# Patient Record
Sex: Female | Born: 1975 | Race: White | Hispanic: No | Marital: Married | Smoking: Never smoker
Health system: Southern US, Community
[De-identification: ages and names within clinical notes are randomized; demographics above are authoritative.]

## PROBLEM LIST (undated history)

## (undated) DIAGNOSIS — M545 Low back pain, unspecified: Secondary | ICD-10-CM

## (undated) HISTORY — DX: Low back pain, unspecified: M54.50

## (undated) HISTORY — DX: Low back pain: M54.5

---

## 2006-10-13 ENCOUNTER — Inpatient Hospital Stay: Payer: Self-pay | Admitting: Obstetrics and Gynecology

## 2007-08-22 ENCOUNTER — Ambulatory Visit: Payer: Self-pay | Admitting: Family Medicine

## 2007-08-22 DIAGNOSIS — B078 Other viral warts: Secondary | ICD-10-CM | POA: Insufficient documentation

## 2008-02-09 ENCOUNTER — Other Ambulatory Visit: Admission: RE | Admit: 2008-02-09 | Discharge: 2008-02-09 | Payer: Self-pay | Admitting: Family Medicine

## 2008-02-09 ENCOUNTER — Ambulatory Visit: Payer: Self-pay | Admitting: Family Medicine

## 2008-02-09 ENCOUNTER — Encounter: Payer: Self-pay | Admitting: Family Medicine

## 2008-02-09 DIAGNOSIS — E669 Obesity, unspecified: Secondary | ICD-10-CM | POA: Insufficient documentation

## 2008-02-13 ENCOUNTER — Encounter (INDEPENDENT_AMBULATORY_CARE_PROVIDER_SITE_OTHER): Payer: Self-pay | Admitting: *Deleted

## 2008-05-02 ENCOUNTER — Ambulatory Visit: Payer: Self-pay | Admitting: Family Medicine

## 2008-05-09 ENCOUNTER — Telehealth (INDEPENDENT_AMBULATORY_CARE_PROVIDER_SITE_OTHER): Payer: Self-pay | Admitting: *Deleted

## 2009-07-26 ENCOUNTER — Other Ambulatory Visit: Admission: RE | Admit: 2009-07-26 | Discharge: 2009-07-26 | Payer: Self-pay | Admitting: Family Medicine

## 2009-07-26 ENCOUNTER — Ambulatory Visit: Payer: Self-pay | Admitting: Family Medicine

## 2009-07-26 ENCOUNTER — Encounter: Payer: Self-pay | Admitting: Family Medicine

## 2009-07-26 DIAGNOSIS — M549 Dorsalgia, unspecified: Secondary | ICD-10-CM | POA: Insufficient documentation

## 2009-07-30 ENCOUNTER — Encounter (INDEPENDENT_AMBULATORY_CARE_PROVIDER_SITE_OTHER): Payer: Self-pay | Admitting: *Deleted

## 2012-02-03 ENCOUNTER — Encounter: Payer: Self-pay | Admitting: Family Medicine

## 2012-03-14 ENCOUNTER — Encounter: Payer: Self-pay | Admitting: Family Medicine

## 2012-03-15 ENCOUNTER — Other Ambulatory Visit (HOSPITAL_COMMUNITY)
Admission: RE | Admit: 2012-03-15 | Discharge: 2012-03-15 | Disposition: A | Discharge status: Home / Self Care | Payer: Managed Care, Other (non HMO) | Source: Ambulatory Visit | Attending: Family Medicine | Admitting: Family Medicine

## 2012-03-15 ENCOUNTER — Encounter: Payer: Self-pay | Admitting: Family Medicine

## 2012-03-15 ENCOUNTER — Ambulatory Visit (INDEPENDENT_AMBULATORY_CARE_PROVIDER_SITE_OTHER): Payer: Managed Care, Other (non HMO) | Admitting: Family Medicine

## 2012-03-15 VITALS — BP 102/70 | HR 87 | Temp 98.6°F | Ht 66.0 in | Wt 177.8 lb

## 2012-03-15 DIAGNOSIS — Z01419 Encounter for gynecological examination (general) (routine) without abnormal findings: Secondary | ICD-10-CM | POA: Insufficient documentation

## 2012-03-15 DIAGNOSIS — Z0000 Encounter for general adult medical examination without abnormal findings: Secondary | ICD-10-CM

## 2012-03-15 DIAGNOSIS — Z1159 Encounter for screening for other viral diseases: Secondary | ICD-10-CM | POA: Insufficient documentation

## 2012-03-15 NOTE — Assessment & Plan Note (Signed)
Reviewed health habits including diet and exercise and skin cancer prevention Also reviewed health mt list, fam hx and immunizations  Commended great job on healthy diet/ exercise and wt loss  Has a reasonable goal and doing well  Labs today Enc to get flu shot next season

## 2012-03-15 NOTE — Progress Notes (Signed)
Subjective:    Patient ID: Felicia Ibarra, female    DOB: [birth year], 35 y.o.   MRN: [id number]  HPI Here for health maintenance exam and to review chronic medical problems   Has been feeling pretty good  Trying to take care of herself   Has not had labs   bp is 102/70- good  Wt is down 3 lb with bmi of 28 (since august - lost 29 lb total )  Uses my fitness pal - and logs on for calories and exercise  Gets 1430 calories per day  Does not feel deprived   For exercise- lifts wt/ circuit train/ zumba/ yoga   Pap was 9/10- was nl No hx of abn paps  Hx -no gyn problems , periods are regular - cycle 30 days / no heavy bleeding or bad cramps - normal for her   Contraception- does not need now  Wants to work on getting pregnant after loosing some more    Flu shot - did not have   Patient Active Problem List  Diagnoses  . PLANTAR WART  . BACK PAIN  . Routine general medical examination at a health care facility  . Gynecological examination   Past Medical History  Diagnosis Date  . Low back pain, episodic    Past Surgical History  Procedure Date  . Cesarean section 2007   History  Substance Use Topics  . Smoking status: Never Smoker   . Smokeless tobacco: Not on file  . Alcohol Use: No   Family History  Problem Relation Age of Onset  . Diabetes Mother   . Hypertension Mother   . Hypothyroidism Mother   . Cancer Father     prostate  . Heart disease Maternal Grandfather     >70  . Heart disease Paternal Grandfather     > 70  . Hyperlipidemia Other   . Hypothyroidism Other   . Hypothyroidism Other    Allergies  Allergen Reactions  . Amoxicillin   . Cefaclor     REACTION: hives  . Ibuprofen     REACTION: hives   Current Outpatient Prescriptions on File Prior to Visit  Medication Sig Dispense Refill  . Multiple Vitamin (MULTIVITAMIN) tablet Take 1 tablet by mouth daily.         Has not had blood work in a while  For chol , and sugar and thyroid        Review of Systems Review of Systems  Constitutional: Negative for fever, appetite change, fatigue and unexpected weight change.  Eyes: Negative for pain and visual disturbance.  Respiratory: Negative for cough and shortness of breath.   Cardiovascular: Negative for cp or palpitations    Gastrointestinal: Negative for nausea, diarrhea and constipation.  Genitourinary: Negative for urgency and frequency.  Skin: Negative for pallor or rash   Neurological: Negative for weakness, light-headedness, numbness and headaches.  Hematological: Negative for adenopathy. Does not bruise/bleed easily.  Psychiatric/Behavioral: Negative for dysphoric mood. The patient is not nervous/anxious.          Objective:   Physical Exam  Constitutional: She appears well-developed and well-nourished. No distress.       overwt and well appearing   HENT:  Head: Normocephalic and atraumatic.  Right Ear: External ear normal.  Left Ear: External ear normal.  Nose: Nose normal.  Mouth/Throat: Oropharynx is clear and moist.  Eyes: Conjunctivae and EOM are normal. Pupils are equal, round, and reactive to light. No scleral icterus.  Neck: Normal range  of motion. Neck supple. No JVD present. Carotid bruit is not present. No thyromegaly present.  Cardiovascular: Normal rate, regular rhythm, normal heart sounds and intact distal pulses.  Exam reveals no gallop.   Pulmonary/Chest: Effort normal and breath sounds normal. No respiratory distress. She has no wheezes. She has no rales. She exhibits no tenderness.  Abdominal: Soft. Bowel sounds are normal. She exhibits no distension, no abdominal bruit and no mass. There is no tenderness.  Genitourinary: Rectum normal, vagina normal and uterus normal. No breast swelling, tenderness, discharge or bleeding. There is no rash or tenderness on the right labia. There is no rash or tenderness on the left labia. Uterus is not enlarged and not tender. Cervix exhibits no motion  tenderness, no discharge and no friability. Right adnexum displays no mass, no tenderness and no fullness. Left adnexum displays no mass, no tenderness and no fullness. No bleeding around the vagina. No vaginal discharge found.       Breast exam: No mass, nodules, thickening, tenderness, bulging, retraction, inflamation, nipple discharge or skin changes noted.  No axillary or clavicular LA.  Chaperoned exam.    Musculoskeletal: Normal range of motion. She exhibits no edema and no tenderness.  Lymphadenopathy:    She has no cervical adenopathy.  Neurological: She is alert. She has normal reflexes. No cranial nerve deficit. She exhibits normal muscle tone. Coordination normal.  Skin: Skin is warm and dry. No rash noted. No erythema. No pallor.       Some lentigos 3-4 mm nevus med R eyebrow is stable  Some regular app brown nevi on back and arms  Psychiatric: She has a normal mood and affect.          Assessment & Plan:

## 2012-03-15 NOTE — Patient Instructions (Signed)
Pap done today Labs today Great job with weight loss so far- keep it up  Keep exercising

## 2012-03-15 NOTE — Assessment & Plan Note (Signed)
Annual exam - pap done  Is on PNV and will consider trying to get pregnant in the fall

## 2012-03-16 LAB — CBC WITH DIFFERENTIAL/PLATELET
Basophils Relative: 0.3 % (ref 0.0–3.0)
Eosinophils Absolute: 0.1 10*3/uL (ref 0.0–0.7)
HCT: 40.7 % (ref 36.0–46.0)
Hemoglobin: 13.5 g/dL (ref 12.0–15.0)
Lymphocytes Relative: 30.3 % (ref 12.0–46.0)
MCHC: 33.2 g/dL (ref 30.0–36.0)
MCV: 87.9 fl (ref 78.0–100.0)
Monocytes Absolute: 0.4 10*3/uL (ref 0.1–1.0)
Neutro Abs: 4.5 10*3/uL (ref 1.4–7.7)
RBC: 4.64 Mil/uL (ref 3.87–5.11)

## 2012-03-16 LAB — LIPID PANEL
Cholesterol: 148 mg/dL (ref 0–200)
HDL: 57.9 mg/dL (ref >39.00)
LDL Cholesterol: 75 mg/dL (ref 0–99)
Total CHOL/HDL Ratio: 3
Triglycerides: 75 mg/dL (ref 0.0–149.0)

## 2012-03-16 LAB — COMPREHENSIVE METABOLIC PANEL
AST: 20 U/L (ref 0–37)
BUN: 9 mg/dL (ref 6–23)
Calcium: 9.5 mg/dL (ref 8.4–10.5)
Chloride: 105 mEq/L (ref 96–112)
Creatinine, Ser: 0.8 mg/dL (ref 0.4–1.2)

## 2012-03-17 ENCOUNTER — Encounter: Payer: Self-pay | Admitting: *Deleted

## 2012-03-23 ENCOUNTER — Encounter: Payer: Self-pay | Admitting: *Deleted

## 2013-07-20 ENCOUNTER — Inpatient Hospital Stay: Payer: Self-pay

## 2013-07-20 LAB — CBC WITH DIFFERENTIAL/PLATELET
Basophil #: 0 10*3/uL (ref 0.0–0.1)
Eosinophil #: 0 10*3/uL (ref 0.0–0.7)
HGB: 11.5 g/dL — ABNORMAL LOW (ref 12.0–16.0)
Lymphocyte #: 2.2 10*3/uL (ref 1.0–3.6)
MCV: 86 fL (ref 80–100)
Neutrophil #: 10 10*3/uL — ABNORMAL HIGH (ref 1.4–6.5)
RDW: 13.2 % (ref 11.5–14.5)

## 2013-07-24 LAB — PATHOLOGY REPORT

## 2015-03-08 NOTE — Op Note (Signed)
PATIENT NAME:  Consepcion HearingROBBINS, Felicia Ibarra MR#:  [id number] DATE OF BIRTH:  01-28-1976  DATE OF PROCEDURE:  07/20/2013  PREOPERATIVE DIAGNOSES:  Term intrauterine pregnancy, attempted vaginal birth after cesarean, fetal intolerance to labor.   POSTOPERATIVE DIAGNOSES:  Term intrauterine pregnancy, attempted vaginal birth after cesarean, fetal intolerance to labor.   PROCEDURE PERFORMED: Low transverse cesarean section.   SURGEON:  Annamarie MajorPaul Harris, M.D.   ASSISTANT: Duwaine MaxinAshley Smith.     ANESTHESIA: General.   BLOOD LOSS: 200 mL.   COMPLICATIONS: None.   FINDINGS: Thin, scarred lower uterine segment, viable female infant weighing 7 pounds, 5 ounces with Apgar scores of 1 and 7 at 1 and 5 minutes, respectively.   DISPOSITION: To recovery room in stable condition.   TECHNIQUE: The patient was prepped and draped in the usual sterile fashion after adequate anesthesia was obtained in the supine position on the operating room table. Scalpel was used to create a low transverse skin incision down to the level of the rectus fascia, which was also dissected bilaterally using the scalpel. The rectus muscles were separated in the midline. Peritoneum penetrated, and the bladder inferiorly retracted. A scalpel was used to create a low transverse hysterotomy incision above the bladder reflection and carried down to the level of full penetration, which is then extended by blunt dissection. Amniotomy reveals clear fluid. The infant's head is grasped and delivered without use of a suction device and is delivered quickly with nuchal cord reduced. Umbilical cord was clamped and cut, and the infant was handed to the pediatric team.   Cord gas and cord blood was obtained. The placenta was manually extracted. The uterus was externalized and cleansed of all membranes and debris using a moist sponge. Hysterotomy incision was closed with a running #1 Vicryl suture in a locking fashion with excellent hemostasis noted. The uterus was placed  back in the intra-abdominal cavity, and the paracolic gutters were irrigated with warm saline. Re-examination of the incision reveals excellent hemostasis. The rectus muscles were plicated, and the rectus fascia was closed with 0 Maxon suture. Subcutaneous tissues were irrigated and hemostasis was assured using electrocautery. The subcutaneous cuticular layer is first injected with 0.5% Marcaine and then closed with 4-0 Vicryl suture in a subcuticular fashion, followed by placement of Dermabond. The patient goes to the recovery room in stable condition. All sponge, instrument and needle counts were correct.    ____________________________ R. Annamarie MajorPaul Harris, MD rph:dmm D: 07/20/2013 22:21:17 ET T: 07/20/2013 22:36:32 ET JOB#: [id number]  cc: Dierdre Searles. Paul Harris, MD, <Dictator> Nadara MustardOBERT P HARRIS MD ELECTRONICALLY SIGNED 07/21/2013 8:56

## 2015-03-26 NOTE — H&P (Signed)
L&D Evaluation:  History:  HPI 39 year old G2 P1001 with hx of prior C-section and EDC=07/16/2013 by LMP=10/09/2012 and a 12 week ultrasound presented at 40 4/7 weeks presented with c/o contractions all night becoming stronger and q5 min this AM. Rates pain at 6-7/10. No LOF or VB. Patient desires VBAC and has been counseled during pregnancy regarding the pros, cons and risks of TOL vs repeat C-section. She has a C-section scheduled for tomorrow if she was not in labor. Her previous C-section was done for FITL at 3 cm. Desires to breast feed. Condoms for pp contraception. LABS: O POS, RI, VI, GBS negative   Presents with contractions   Patient's Medical History No Chronic Illness   Patient's Surgical History Previous C-Section   Medications Pre Natal Vitamins   Allergies PCN, cephalosporins, ibuprofen-all cause hives.   Social History none   Family History Non-Contributory   ROS:  ROS see HPI   Exam:  Vital Signs stable  114/71   Urine Protein not completed   General breathing thru contractions   Mental Status clear   Chest clear   Heart normal sinus rhythm, no murmur/gallop/rubs   Abdomen gravid, tender with contractions   Estimated Fetal Weight Average for gestational age   Fetal Position cephalic   Edema no edema   Reflexes 1+   Pelvic no external lesions, cx 4/80%/ballotable per RN exam-no change after 2 hrs of walking   Mebranes Intact   FHT normal rate with no decels, 130 with accels to 150s   FHT Description mod variability   Ucx q3-5 min apart, palpate mod   Skin dry   Impression:  Impression IUP at 40 4/7 weeks in early labor. Prior C-section-desires TOLAC   Plan:  Plan EFM/NST, monitor contractions and for cervical change, Exhausted with no sleep all night. Offered therapeutic rest, AROM,.  Patient desires therapeutic rest. Morphine 10 mgm and Phenergan 12.5 mgm IM. Monitor for progression-initiate VBAC protocol when active.   Electronic  Signatures: Trinna BalloonGutierrez, Dulcy Sida L (CNM)  (Signed 04-Sep-14 10:40)  Authored: L&D Evaluation   Last Updated: 04-Sep-14 10:40 by Trinna BalloonGutierrez, Laurabelle Gorczyca L (CNM)

## 2015-04-24 ENCOUNTER — Ambulatory Visit (INDEPENDENT_AMBULATORY_CARE_PROVIDER_SITE_OTHER): Payer: 59 | Admitting: Family Medicine

## 2015-04-24 ENCOUNTER — Other Ambulatory Visit (HOSPITAL_COMMUNITY)
Admission: RE | Admit: 2015-04-24 | Discharge: 2015-04-24 | Disposition: A | Discharge status: Home / Self Care | Payer: 59 | Source: Ambulatory Visit | Attending: Family Medicine | Admitting: Family Medicine

## 2015-04-24 ENCOUNTER — Encounter: Payer: Self-pay | Admitting: Family Medicine

## 2015-04-24 VITALS — BP 100/80 | HR 72 | Temp 98.9°F | Ht 64.5 in

## 2015-04-24 DIAGNOSIS — Z01419 Encounter for gynecological examination (general) (routine) without abnormal findings: Secondary | ICD-10-CM | POA: Diagnosis not present

## 2015-04-24 DIAGNOSIS — Z0000 Encounter for general adult medical examination without abnormal findings: Secondary | ICD-10-CM | POA: Diagnosis not present

## 2015-04-24 DIAGNOSIS — Z1151 Encounter for screening for human papillomavirus (HPV): Secondary | ICD-10-CM | POA: Insufficient documentation

## 2015-04-24 NOTE — Assessment & Plan Note (Signed)
Routine exam with pap No complaints  

## 2015-04-24 NOTE — Progress Notes (Signed)
Subjective:    Patient ID: Felicia Ibarra, female    DOB: 1976/03/15, 39 y.o.   MRN: [id number]  HPI   Here for health maintenance exam and to review chronic medical problems    Lost 45 lb with diet and exercise  Doing great  Uses myfitness pal Runs    Declines HIV screen-not high risk   Pap nl 12/14  Does not go to gyn any more  Wants to get pap today  Periods are normal and regular for the most part  Not very heavy or painful   Flu shot -did not get   Tetanus shot 3/09  Wants to get wellness labs today   Has a mole on her face - over eyebrow R  Interested in seeing derm- will make appt    Review of Systems Review of Systems  Constitutional: Negative for fever, appetite change, fatigue and unexpected weight change.  Eyes: Negative for pain and visual disturbance.  Respiratory: Negative for cough and shortness of breath.   Cardiovascular: Negative for cp or palpitations    Gastrointestinal: Negative for nausea, diarrhea and constipation.  Genitourinary: Negative for urgency and frequency.  Skin: Negative for pallor or rash   Neurological: Negative for weakness, light-headedness, numbness and headaches.  Hematological: Negative for adenopathy. Does not bruise/bleed easily.  Psychiatric/Behavioral: Negative for dysphoric mood. The patient is not nervous/anxious.         Objective:   Physical Exam  Constitutional: She appears well-developed and well-nourished. No distress.  HENT:  Head: Normocephalic and atraumatic.  Right Ear: External ear normal.  Left Ear: External ear normal.  Nose: Nose normal.  Mouth/Throat: Oropharynx is clear and moist.  Eyes: Conjunctivae and EOM are normal. Pupils are equal, round, and reactive to light. Right eye exhibits no discharge. Left eye exhibits no discharge. No scleral icterus.  Neck: Normal range of motion. Neck supple. No JVD present. Carotid bruit is not present. No thyromegaly present.  Cardiovascular: Normal rate,  regular rhythm, normal heart sounds and intact distal pulses.  Exam reveals no gallop.   Pulmonary/Chest: Effort normal and breath sounds normal. No respiratory distress. She has no wheezes. She has no rales.  Abdominal: Soft. Bowel sounds are normal. She exhibits no distension and no mass. There is no tenderness.  Genitourinary: No breast swelling, tenderness, discharge or bleeding. There is no rash, tenderness or lesion on the right labia. There is no rash, tenderness or lesion on the left labia. Uterus is not enlarged and not tender. Cervix exhibits no motion tenderness, no discharge and no friability. Right adnexum displays no mass, no tenderness and no fullness. Left adnexum displays no mass, no tenderness and no fullness. No tenderness or bleeding in the vagina. No vaginal discharge found.  Breast exam: No mass, nodules, thickening, tenderness, bulging, retraction, inflamation, nipple discharge or skin changes noted.  No axillary or clavicular LA.      Musculoskeletal: She exhibits no edema or tenderness.  Lymphadenopathy:    She has no cervical adenopathy.  Neurological: She is alert. She has normal reflexes. No cranial nerve deficit. She exhibits normal muscle tone. Coordination normal.  Skin: Skin is warm and dry. No rash noted. No erythema. No pallor.  Psychiatric: She has a normal mood and affect.          Assessment & Plan:   Problem List Items Addressed This Visit    Encounter for routine gynecological examination    Routine exam with pap  No complaints  Relevant Orders   Cytology - PAP   Routine general medical examination at a health care facility - Primary    Reviewed health habits including diet and exercise and skin cancer prevention Reviewed appropriate screening tests for age  Also reviewed health mt list, fam hx and immunization status , as well as social and family history   See HPI Labs for wellness today  Commended on wt loss and healthy habits        Relevant Orders   CBC with Differential/Platelet (Completed)   Comprehensive metabolic panel (Completed)   TSH (Completed)   Lipid panel (Completed)

## 2015-04-24 NOTE — Progress Notes (Signed)
Pre visit review using our clinic review tool, if applicable. No additional management support is needed unless otherwise documented below in the visit note. 

## 2015-04-24 NOTE — Patient Instructions (Signed)
Labs today  Keep up the great work with healthy habits and weight loss  Pap done   Take care of yourself

## 2015-04-24 NOTE — Assessment & Plan Note (Signed)
Reviewed health habits including diet and exercise and skin cancer prevention Reviewed appropriate screening tests for age  Also reviewed health mt list, fam hx and immunization status , as well as social and family history   See HPI Labs for wellness today  Commended on wt loss and healthy habits

## 2015-04-25 LAB — COMPREHENSIVE METABOLIC PANEL
ALT: 10 U/L (ref 0–35)
AST: 17 U/L (ref 0–37)
Albumin: 4.3 g/dL (ref 3.5–5.2)
Alkaline Phosphatase: 57 U/L (ref 39–117)
BUN: 10 mg/dL (ref 6–23)
CHLORIDE: 103 meq/L (ref 96–112)
CO2: 26 mEq/L (ref 19–32)
Calcium: 9.3 mg/dL (ref 8.4–10.5)
Creatinine, Ser: 0.82 mg/dL (ref 0.40–1.20)
GFR: 83.42 mL/min (ref >60.00)
GLUCOSE: 81 mg/dL (ref 70–99)
Potassium: 4 mEq/L (ref 3.5–5.1)
SODIUM: 135 meq/L (ref 135–145)
Total Bilirubin: 0.7 mg/dL (ref 0.2–1.2)
Total Protein: 7.8 g/dL (ref 6.0–8.3)

## 2015-04-25 LAB — CBC WITH DIFFERENTIAL/PLATELET
Basophils Absolute: 0.1 10*3/uL (ref 0.0–0.1)
Basophils Relative: 1 % (ref 0.0–3.0)
EOS ABS: 0.1 10*3/uL (ref 0.0–0.7)
Eosinophils Relative: 1.4 % (ref 0.0–5.0)
HCT: 41.5 % (ref 36.0–46.0)
HEMOGLOBIN: 13.9 g/dL (ref 12.0–15.0)
LYMPHS PCT: 27.1 % (ref 12.0–46.0)
Lymphs Abs: 2.8 10*3/uL (ref 0.7–4.0)
MCHC: 33.5 g/dL (ref 30.0–36.0)
MCV: 85.8 fl (ref 78.0–100.0)
Monocytes Absolute: 0.4 10*3/uL (ref 0.1–1.0)
Monocytes Relative: 3.7 % (ref 3.0–12.0)
Neutro Abs: 6.9 10*3/uL (ref 1.4–7.7)
Neutrophils Relative %: 66.8 % (ref 43.0–77.0)
Platelets: 314 10*3/uL (ref 150.0–400.0)
RBC: 4.84 Mil/uL (ref 3.87–5.11)
RDW: 13.5 % (ref 11.5–15.5)
WBC: 10.3 10*3/uL (ref 4.0–10.5)

## 2015-04-25 LAB — LIPID PANEL
CHOL/HDL RATIO: 3
CHOLESTEROL: 158 mg/dL (ref 0–200)
HDL: 60 mg/dL (ref >39.00)
LDL Cholesterol: 86 mg/dL (ref 0–99)
NONHDL: 98
TRIGLYCERIDES: 59 mg/dL (ref 0.0–149.0)
VLDL: 11.8 mg/dL (ref 0.0–40.0)

## 2015-04-25 LAB — TSH: TSH: 2.1 u[IU]/mL (ref 0.35–4.50)

## 2015-04-26 ENCOUNTER — Encounter: Payer: Self-pay | Admitting: *Deleted

## 2015-04-26 LAB — CYTOLOGY - PAP

## 2015-04-30 ENCOUNTER — Encounter: Payer: Self-pay | Admitting: *Deleted

## 2016-04-29 ENCOUNTER — Encounter: Payer: 59 | Admitting: Family Medicine

## 2016-09-02 ENCOUNTER — Encounter: Payer: Self-pay | Admitting: Family Medicine

## 2016-09-02 ENCOUNTER — Other Ambulatory Visit (HOSPITAL_COMMUNITY)
Admission: RE | Admit: 2016-09-02 | Discharge: 2016-09-02 | Disposition: A | Discharge status: Home / Self Care | Payer: PRIVATE HEALTH INSURANCE | Source: Ambulatory Visit | Attending: Family Medicine | Admitting: Family Medicine

## 2016-09-02 ENCOUNTER — Ambulatory Visit (INDEPENDENT_AMBULATORY_CARE_PROVIDER_SITE_OTHER): Payer: PRIVATE HEALTH INSURANCE | Admitting: Family Medicine

## 2016-09-02 VITALS — BP 104/66 | HR 76 | Temp 98.2°F | Ht 64.5 in | Wt 175.0 lb

## 2016-09-02 DIAGNOSIS — G43009 Migraine without aura, not intractable, without status migrainosus: Secondary | ICD-10-CM

## 2016-09-02 DIAGNOSIS — Z23 Encounter for immunization: Secondary | ICD-10-CM

## 2016-09-02 DIAGNOSIS — Z0000 Encounter for general adult medical examination without abnormal findings: Secondary | ICD-10-CM | POA: Diagnosis not present

## 2016-09-02 DIAGNOSIS — Z01419 Encounter for gynecological examination (general) (routine) without abnormal findings: Secondary | ICD-10-CM | POA: Diagnosis not present

## 2016-09-02 NOTE — Progress Notes (Signed)
Subjective:    Patient ID: Felicia Ibarra, female    DOB: September 14, 1976, 40 y.o.   MRN: [id number]  HPI Here for health maintenance exam and to review chronic medical problems    Doing well  Went to [facility] recently - got to go to [employer] from Last 3 Encounters:  09/02/16 175 lb (79.4 kg)  03/15/12 177 lb 12 oz (80.6 kg)  07/26/09 181 lb (82.1 kg)  keeping her weight off  She likes to run- hurt her foot so some walk/jogging and it is getting better  She is mixing up different forms of exercise including biking  Trying to eat really well  bmi is 29.5  Flu shot -today   HIV screening -declines/not high risk  Tetanus shot 3/09  Pap 6/16-normal  Wants to do that  Never had any abn paps  Still not seeing a gyn  Periods are pretty normal  Regular  Last avg 5 d / not too heavy or painful  Not on birth control  Not trying to get pregnant - using condoms (happy using them for now)   BP Readings from Last 3 Encounters:  09/02/16 104/66  04/24/15 100/80  03/15/12 102/70        Review of Systems Review of Systems  Constitutional: Negative for fever, appetite change, fatigue and unexpected weight change.  Eyes: Negative for pain and visual disturbance.  Respiratory: Negative for cough and shortness of breath.   Cardiovascular: Negative for cp or palpitations    Gastrointestinal: Negative for nausea, diarrhea and constipation.  Genitourinary: Negative for urgency and frequency.  Skin: Negative for pallor or rash   Neurological: Negative for weakness, light-headedness, numbness and pos for headaches.  Hematological: Negative for adenopathy. Does not bruise/bleed easily.  Psychiatric/Behavioral: Negative for dysphoric mood. The patient is not nervous/anxious.         Objective:   Physical Exam  Constitutional: She appears well-developed and well-nourished. No distress.  overwt and well app  HENT:  Head: Normocephalic and atraumatic.  Right Ear: External ear  normal.  Left Ear: External ear normal.  Mouth/Throat: Oropharynx is clear and moist.  Eyes: Conjunctivae and EOM are normal. Pupils are equal, round, and reactive to light. No scleral icterus.  Neck: Normal range of motion. Neck supple. No JVD present. Carotid bruit is not present. No thyromegaly present.  Cardiovascular: Normal rate, regular rhythm, normal heart sounds and intact distal pulses.  Exam reveals no gallop.   Pulmonary/Chest: Effort normal and breath sounds normal. No respiratory distress. She has no wheezes. She exhibits no tenderness.  Abdominal: Soft. Bowel sounds are normal. She exhibits no distension, no abdominal bruit and no mass. There is no tenderness.  Genitourinary: No breast swelling, tenderness, discharge or bleeding. No vaginal discharge found.  Genitourinary Comments: Breast exam: No mass, nodules, thickening, tenderness, bulging, retraction, inflamation, nipple discharge or skin changes noted.  No axillary or clavicular LA.             Anus appears normal w/o hemorrhoids or masses     External genitalia : nl appearance and hair distribution/no lesions     Urethral meatus : nl size, no lesions or prolapse     Urethra: no masses, tenderness or scarring    Bladder : no masses or tenderness     Vagina: nl general appearance, no discharge or  Lesions, no significant cystocele  or rectocele     Cervix: no lesions/ discharge or friability  Uterus: nl size, contour, position, and mobility (not fixed) , non tender    Adnexa : no masses, tenderness, enlargement or nodularity        Musculoskeletal: Normal range of motion. She exhibits no edema or tenderness.  Lymphadenopathy:    She has no cervical adenopathy.  Neurological: She is alert. She has normal reflexes. No cranial nerve deficit or sensory deficit. She exhibits normal muscle tone. Coordination and gait normal.  Skin: Skin is warm and dry. No rash noted. No erythema. No pallor.  Fair  complexion  Psychiatric: She has a normal mood and affect.          Assessment & Plan:   Problem List Items Addressed This Visit      Cardiovascular and Mediastinum   Migraines    Pt mentioned this on the way out -more frequent headaches that are throbbing and one sided Counseled on lifestyle change to reduce them-see AVS Info given  F/u to disc further if no imp         Other   RESOLVED: Encounter for routine gynecological examination   Routine general medical examination at a health care facility - Primary    Reviewed health habits including diet and exercise and skin cancer prevention Reviewed appropriate screening tests for age  Also reviewed health mt list, fam hx and immunization status , as well as social and family history   See HPI Labs today  Commended on keeping weight off  Enc to keep exercising  Flu shot today      Relevant Orders   Cytology - PAP   CBC with Differential/Platelet   Comprehensive metabolic panel   TSH   Lipid panel    Other Visit Diagnoses    Need for influenza vaccination       Relevant Orders   Flu Vaccine QUAD 36+ mos IM (Completed)

## 2016-09-02 NOTE — Patient Instructions (Addendum)
Take a look at the info on headaches  Stay hydrated  Work on sleep habits Avoid regular caffeine  Aleve is a good choice for a migraine  If you start getting them more often- follow up   Labs today  Flu shot  Keep exercising

## 2016-09-02 NOTE — Progress Notes (Signed)
Pre visit review using our clinic review tool, if applicable. No additional management support is needed unless otherwise documented below in the visit note. 

## 2016-09-03 DIAGNOSIS — G43909 Migraine, unspecified, not intractable, without status migrainosus: Secondary | ICD-10-CM | POA: Insufficient documentation

## 2016-09-03 LAB — COMPREHENSIVE METABOLIC PANEL
ALT: 9 U/L (ref 0–35)
AST: 18 U/L (ref 0–37)
Albumin: 4.4 g/dL (ref 3.5–5.2)
Alkaline Phosphatase: 49 U/L (ref 39–117)
BUN: 10 mg/dL (ref 6–23)
CALCIUM: 9.5 mg/dL (ref 8.4–10.5)
CHLORIDE: 101 meq/L (ref 96–112)
CO2: 28 meq/L (ref 19–32)
CREATININE: 0.8 mg/dL (ref 0.40–1.20)
GFR: 85.21 mL/min (ref >60.00)
Glucose, Bld: 81 mg/dL (ref 70–99)
POTASSIUM: 4.5 meq/L (ref 3.5–5.1)
Sodium: 137 mEq/L (ref 135–145)
Total Bilirubin: 0.5 mg/dL (ref 0.2–1.2)
Total Protein: 8 g/dL (ref 6.0–8.3)

## 2016-09-03 LAB — CBC WITH DIFFERENTIAL/PLATELET
BASOS PCT: 0.9 % (ref 0.0–3.0)
Basophils Absolute: 0.1 10*3/uL (ref 0.0–0.1)
EOS ABS: 0.1 10*3/uL (ref 0.0–0.7)
EOS PCT: 1.5 % (ref 0.0–5.0)
HEMATOCRIT: 41.2 % (ref 36.0–46.0)
Hemoglobin: 13.6 g/dL (ref 12.0–15.0)
LYMPHS PCT: 23.9 % (ref 12.0–46.0)
Lymphs Abs: 2.1 10*3/uL (ref 0.7–4.0)
MCHC: 33.1 g/dL (ref 30.0–36.0)
MCV: 85.9 fl (ref 78.0–100.0)
MONO ABS: 0.4 10*3/uL (ref 0.1–1.0)
Monocytes Relative: 4.8 % (ref 3.0–12.0)
NEUTROS ABS: 6.1 10*3/uL (ref 1.4–7.7)
Neutrophils Relative %: 68.9 % (ref 43.0–77.0)
PLATELETS: 371 10*3/uL (ref 150.0–400.0)
RBC: 4.8 Mil/uL (ref 3.87–5.11)
RDW: 13.1 % (ref 11.5–15.5)
WBC: 8.8 10*3/uL (ref 4.0–10.5)

## 2016-09-03 LAB — LIPID PANEL
CHOLESTEROL: 174 mg/dL (ref 0–200)
HDL: 59.9 mg/dL (ref >39.00)
LDL Cholesterol: 103 mg/dL — ABNORMAL HIGH (ref 0–99)
NonHDL: 113.76
TRIGLYCERIDES: 56 mg/dL (ref 0.0–149.0)
Total CHOL/HDL Ratio: 3
VLDL: 11.2 mg/dL (ref 0.0–40.0)

## 2016-09-03 LAB — TSH: TSH: 1.23 u[IU]/mL (ref 0.35–4.50)

## 2016-09-03 LAB — CYTOLOGY - PAP: Diagnosis: NEGATIVE

## 2016-09-03 NOTE — Assessment & Plan Note (Signed)
Pt mentioned this on the way out -more frequent headaches that are throbbing and one sided Counseled on lifestyle change to reduce them-see AVS Info given  F/u to disc further if no imp

## 2016-09-03 NOTE — Assessment & Plan Note (Signed)
Reviewed health habits including diet and exercise and skin cancer prevention Reviewed appropriate screening tests for age  Also reviewed health mt list, fam hx and immunization status , as well as social and family history   See HPI Labs today  Commended on keeping weight off  Enc to keep exercising  Flu shot today

## 2016-10-05 ENCOUNTER — Encounter: Payer: Self-pay | Admitting: Family Medicine

## 2016-10-12 ENCOUNTER — Ambulatory Visit: Payer: Self-pay | Admitting: Family Medicine

## 2016-11-16 DIAGNOSIS — G43909 Migraine, unspecified, not intractable, without status migrainosus: Secondary | ICD-10-CM

## 2016-11-16 HISTORY — DX: Migraine, unspecified, not intractable, without status migrainosus: G43.909

## 2019-11-22 ENCOUNTER — Encounter: Payer: Self-pay | Admitting: Family Medicine

## 2019-12-21 ENCOUNTER — Encounter: Payer: Self-pay | Admitting: Family Medicine

## 2019-12-22 NOTE — Progress Notes (Signed)
I left a detailed message on patient's voice mail letting her know she can schedule a new patient appointment with Dr.Tower.

## 2019-12-22 NOTE — Telephone Encounter (Signed)
Dr. Janace Hoard setting her up as a new pt. Please schedule when able. thanks

## 2020-01-23 ENCOUNTER — Encounter: Payer: Self-pay | Admitting: Family Medicine

## 2020-01-23 ENCOUNTER — Other Ambulatory Visit (HOSPITAL_COMMUNITY)
Admission: RE | Admit: 2020-01-23 | Discharge: 2020-01-23 | Disposition: A | Discharge status: Home / Self Care | Payer: BLUE CROSS/BLUE SHIELD | Source: Ambulatory Visit | Attending: Family Medicine | Admitting: Family Medicine

## 2020-01-23 ENCOUNTER — Other Ambulatory Visit: Payer: Self-pay

## 2020-01-23 ENCOUNTER — Ambulatory Visit: Payer: BLUE CROSS/BLUE SHIELD | Admitting: Family Medicine

## 2020-01-23 VITALS — BP 116/70 | HR 86 | Temp 98.2°F | Ht 64.5 in | Wt 184.2 lb

## 2020-01-23 DIAGNOSIS — Z23 Encounter for immunization: Secondary | ICD-10-CM

## 2020-01-23 DIAGNOSIS — G43009 Migraine without aura, not intractable, without status migrainosus: Secondary | ICD-10-CM

## 2020-01-23 DIAGNOSIS — Z0000 Encounter for general adult medical examination without abnormal findings: Secondary | ICD-10-CM

## 2020-01-23 DIAGNOSIS — Z01419 Encounter for gynecological examination (general) (routine) without abnormal findings: Secondary | ICD-10-CM | POA: Diagnosis not present

## 2020-01-23 DIAGNOSIS — Z1231 Encounter for screening mammogram for malignant neoplasm of breast: Secondary | ICD-10-CM

## 2020-01-23 DIAGNOSIS — E669 Obesity, unspecified: Secondary | ICD-10-CM

## 2020-01-23 LAB — COMPREHENSIVE METABOLIC PANEL
ALT: 10 U/L (ref 0–35)
AST: 16 U/L (ref 0–37)
Albumin: 4.1 g/dL (ref 3.5–5.2)
Alkaline Phosphatase: 57 U/L (ref 39–117)
BUN: 12 mg/dL (ref 6–23)
CO2: 27 mEq/L (ref 19–32)
Calcium: 9.3 mg/dL (ref 8.4–10.5)
Chloride: 102 mEq/L (ref 96–112)
Creatinine, Ser: 0.93 mg/dL (ref 0.40–1.20)
GFR: 66.23 mL/min (ref >60.00)
Glucose, Bld: 93 mg/dL (ref 70–99)
Potassium: 4.4 mEq/L (ref 3.5–5.1)
Sodium: 135 mEq/L (ref 135–145)
Total Bilirubin: 0.7 mg/dL (ref 0.2–1.2)
Total Protein: 7.5 g/dL (ref 6.0–8.3)

## 2020-01-23 LAB — CBC WITH DIFFERENTIAL/PLATELET
Basophils Absolute: 0.1 10*3/uL (ref 0.0–0.1)
Basophils Relative: 1.1 % (ref 0.0–3.0)
Eosinophils Absolute: 0.2 10*3/uL (ref 0.0–0.7)
Eosinophils Relative: 3.5 % (ref 0.0–5.0)
HCT: 37.5 % (ref 36.0–46.0)
Hemoglobin: 12.2 g/dL (ref 12.0–15.0)
Lymphocytes Relative: 28.2 % (ref 12.0–46.0)
Lymphs Abs: 1.5 10*3/uL (ref 0.7–4.0)
MCHC: 32.5 g/dL (ref 30.0–36.0)
MCV: 79.6 fl (ref 78.0–100.0)
Monocytes Absolute: 0.4 10*3/uL (ref 0.1–1.0)
Monocytes Relative: 7.5 % (ref 3.0–12.0)
Neutro Abs: 3.1 10*3/uL (ref 1.4–7.7)
Neutrophils Relative %: 59.7 % (ref 43.0–77.0)
Platelets: 269 10*3/uL (ref 150.0–400.0)
RBC: 4.71 Mil/uL (ref 3.87–5.11)
RDW: 15.6 % — ABNORMAL HIGH (ref 11.5–15.5)
WBC: 5.2 10*3/uL (ref 4.0–10.5)

## 2020-01-23 LAB — LIPID PANEL
Cholesterol: 162 mg/dL (ref 0–200)
HDL: 58.8 mg/dL (ref >39.00)
LDL Cholesterol: 91 mg/dL (ref 0–99)
NonHDL: 103.05
Total CHOL/HDL Ratio: 3
Triglycerides: 60 mg/dL (ref 0.0–149.0)
VLDL: 12 mg/dL (ref 0.0–40.0)

## 2020-01-23 LAB — TSH: TSH: 1.8 u[IU]/mL (ref 0.35–4.50)

## 2020-01-23 MED ORDER — SUMATRIPTAN SUCCINATE 100 MG PO TABS: 100.0000 mg | ORAL_TABLET | Freq: Once | ORAL | 3 refills | Status: DC

## 2020-01-23 NOTE — Assessment & Plan Note (Addendum)
Reviewed health habits including diet and exercise and skin cancer prevention Reviewed appropriate screening tests for age  Also reviewed health mt list, fam hx and immunization status , as well as social and family history   See HPI Labs for wellness drawn  Gyn exam and pap done  Tdap updated  First mammogram ordered-pt will call to schedule at [employer] good health habits incl sun protection  Enc her to get the covid vaccine series when able Enc counseling again for stress rxn as needed  Enc her to continue [employer] program for wt loss/ doing well

## 2020-01-23 NOTE — Assessment & Plan Note (Signed)
Discussed how this problem influences overall health and the risks it imposes  Reviewed plan for weight loss with lower calorie diet (via better food choices and also portion control or program like weight watchers) and exercise building up to or more than 30 minutes 5 days per week including some aerobic activity   Doing well with [employer] program -commended

## 2020-01-23 NOTE — Progress Notes (Signed)
Subjective:    Patient ID: Felicia Ibarra, female    DOB: Apr 13, 1976, 44 y.o.   MRN: [id number]  This visit occurred during the SARS-CoV-2 public health emergency.  Safety protocols were in place, including screening questions prior to the visit, additional usage of staff PPE, and extensive cleaning of exam room while observing appropriate contact time as indicated for disinfecting solutions.    HPI Pt presents to re establish for care and also health mt exam   New fam hx   GM passed away 05-Mar-2016   (a fib and a clot)  M uncle died in 11s - h/o migrianes but ? What he died from  (sudden cardiac death)  Added these to fam history   Lots of thyroid problems =anx to check her thyroid     Wt Readings from Last 3 Encounters:  01/23/20 184 lb 4 oz (83.6 kg)  09/02/16 175 lb (79.4 kg)  03/15/12 177 lb 12 oz (80.6 kg)  lost 15lb so far-she is using [employer] program and doing well with it  u tube videos/walks/treadmill for exercise   (she tries to exercise every day)  31.14 kg/m   Td 3/09-due for that   Daughter is a teen / is moody  Stressful  Able to use her coping strategies  Not seeing a counselor but was in the past   Flu vaccine -did not get a flu shot  Not going anywhere   She is considering covid vaccine   Needs first mammogram  No lumps on self exam   Pap 10/17 -neg No h/o HPV Menses - last one was unusual (stops and starts) - 10 days (not very heavy) Not painful  No need for contraception  Always regular- uses an app to chart    Needs a pap today   Needs wellness labs   Migraines Thinks are hormonal - severe ones tend to occur during her menses  Gets nauseated with pain behind eye alternates sides  Disabling  Lasts 2-3 days  (worst is 24 h)  Alt naproxen and tylenol -not very effective   One per month   BP Readings from Last 3 Encounters:  01/23/20 116/70  09/02/16 104/66  04/24/15 100/80   Pulse Readings from Last 3 Encounters:  01/23/20 86  09/02/16  76  04/24/15 72   Diet is very good  Lots of veg/fruit - gets her carbs from produce Whole family is eating better   Patient Active Problem List   Diagnosis Date Noted  . Visit for routine gyn exam 01/23/2020  . Screening mammogram, encounter for 01/23/2020  . Migraines 09/03/2016  . Routine general medical examination at a health care facility 03/15/2012  . BACK PAIN 07/26/2009  . 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 Low back pain, episodic    Past Surgical History:  Procedure Laterality Date  . CESAREAN SECTION  03-05-06   Social History   Tobacco Use  . Smoking status: Never Smoker  . Smokeless tobacco: Never Used  Substance Use Topics  . Alcohol use: No    Alcohol/week: 0.0 standard drinks  . Drug use: No   Family History  Problem Relation Age of Onset  . Diabetes Mother   . Hypertension Mother   . Hypothyroidism Mother   . Cancer Father        prostate  . Heart disease Maternal Grandfather        >70  . Heart disease Paternal Grandfather        >  70  . Hyperlipidemia Other   . Hypothyroidism Other   . Hypothyroidism Other   . Sudden Cardiac Death Maternal Uncle   . Atrial fibrillation Maternal Grandmother    Allergies  Allergen Reactions  . Amoxicillin   . Cefaclor     REACTION: hives  . Ibuprofen     REACTION: hives   Current Outpatient Medications on File Prior to Visit  Medication Sig Dispense Refill  . Multiple Vitamin (MULTIVITAMIN) tablet Take 1 tablet by mouth daily.     No current facility-administered medications on file prior to visit.     Review of Systems  Constitutional: Negative for activity change, appetite change, fatigue, fever and unexpected weight change.  HENT: Negative for congestion, ear pain, rhinorrhea, sinus pressure and sore throat.   Eyes: Negative for pain, redness and visual disturbance.  Respiratory: Negative for cough, shortness of breath and wheezing.   Cardiovascular: Negative for  chest pain and palpitations.  Gastrointestinal: Negative for abdominal pain, blood in stool, constipation and diarrhea.  Endocrine: Negative for polydipsia and polyuria.  Genitourinary: Negative for dysuria, frequency and urgency.  Musculoskeletal: Negative for arthralgias, back pain and myalgias.  Skin: Negative for pallor and rash.  Allergic/Immunologic: Negative for environmental allergies.  Neurological: Positive for headaches. Negative for dizziness and syncope.  Hematological: Negative for adenopathy. Does not bruise/bleed easily.  Psychiatric/Behavioral: Negative for decreased concentration and dysphoric mood. The patient is not nervous/anxious.        Objective:   Physical Exam Constitutional:      General: She is not in acute distress.    Appearance: Normal appearance. She is well-developed. She is obese. She is not ill-appearing or diaphoretic.  HENT:     Head: Normocephalic and atraumatic.     Right Ear: Tympanic membrane, ear canal and external ear normal.     Left Ear: Tympanic membrane, ear canal and external ear normal.     Nose: Nose normal. No congestion.     Mouth/Throat:     Mouth: Mucous membranes are moist.     Pharynx: Oropharynx is clear. No posterior oropharyngeal erythema.  Eyes:     General: No scleral icterus.    Extraocular Movements: Extraocular movements intact.     Conjunctiva/sclera: Conjunctivae normal.     Pupils: Pupils are equal, round, and reactive to light.  Neck:     Thyroid: No thyromegaly.     Vascular: No carotid bruit or JVD.  Cardiovascular:     Rate and Rhythm: Normal rate and regular rhythm.     Pulses: Normal pulses.     Heart sounds: Normal heart sounds. No gallop.   Pulmonary:     Effort: Pulmonary effort is normal. No respiratory distress.     Breath sounds: Normal breath sounds. No wheezing.     Comments: Good air exch Chest:     Chest wall: No tenderness.  Abdominal:     General: Bowel sounds are normal. There is no  distension or abdominal bruit.     Palpations: Abdomen is soft. There is no mass.     Tenderness: There is no abdominal tenderness.     Hernia: No hernia is present.  Genitourinary:    Comments: Breast exam: No mass, nodules, thickening, tenderness, bulging, retraction, inflamation, nipple discharge or skin changes noted.  No axillary or clavicular LA.              Anus appears normal w/o hemorrhoids or masses     External genitalia : nl appearance  and hair distribution/no lesions     Urethral meatus : nl size, no lesions or prolapse     Urethra: no masses, tenderness or scarring    Bladder : no masses or tenderness     Vagina: nl general appearance, no discharge or  Lesions, no significant cystocele  or rectocele     Cervix: no lesions/ discharge or friability    Uterus: nl size, contour, position, and mobility (not fixed) , non tender    Adnexa : no masses, tenderness, enlargement or nodularity       Musculoskeletal:        General: No tenderness. Normal range of motion.     Cervical back: Normal range of motion and neck supple. No rigidity. No muscular tenderness.     Right lower leg: No edema.     Left lower leg: No edema.  Lymphadenopathy:     Cervical: No cervical adenopathy.  Skin:    General: Skin is warm and dry.     Coloration: Skin is not pale.     Findings: No erythema or rash.     Comments: Fair with some stable tan nevi   Neurological:     Mental Status: She is alert. Mental status is at baseline.     Cranial Nerves: No cranial nerve deficit.     Motor: No abnormal muscle tone.     Coordination: Coordination normal.     Gait: Gait normal.     Deep Tendon Reflexes: Reflexes are normal and symmetric. Reflexes normal.  Psychiatric:        Mood and Affect: Mood normal.        Cognition and Memory: Cognition and memory normal.           Assessment & Plan:   Problem List Items Addressed This Visit      Cardiovascular and Mediastinum    Migraines    Pt tends to have one migraine per mo early in menses that can be severe Will try imitrex 100 mg for rescue  Discussed dosing and info given on this medication  Good habits for headache prevention  inst to update if this does not help      Relevant Medications   SUMAtriptan (IMITREX) 100 MG tablet     Other   Obesity (BMI 30-39.9)    Discussed how this problem influences overall health and the risks it imposes  Reviewed plan for weight loss with lower calorie diet (via better food choices and also portion control or program like weight watchers) and exercise building up to or more than 30 minutes 5 days per week including some aerobic activity   Doing well with [employer] program -commended       Routine general medical examination at a health care facility - Primary    Reviewed health habits including diet and exercise and skin cancer prevention Reviewed appropriate screening tests for age  Also reviewed health mt list, fam hx and immunization status , as well as social and family history   See HPI Labs for wellness drawn  Gyn exam and pap done  Tdap updated  First mammogram ordered-pt will call to schedule at [facility] good health habits incl sun protection  Enc her to get the covid vaccine series when able Enc counseling again for stress rxn as needed  Enc her to continue [employer] program for wt loss/ doing well      Relevant Orders   CBC with Differential/Platelet   Comprehensive metabolic panel  Lipid panel   TSH   Tdap vaccine greater than or equal to 7yo IM (Completed)   Visit for routine gyn exam    Routine exam w/o abn Does not need contraception  May be starting some peri menopausal menses changes  Pap done-pend result      Relevant Orders   Cytology - PAP([place])   Screening mammogram, encounter for    Ordered first mammogram and pt will schedule  Enc regular self breast exams      Relevant Orders   MM 3D SCREEN BREAST BILATERAL    Other  Visit Diagnoses    Need for Tdap vaccination       Relevant Orders   Tdap vaccine greater than or equal to 7yo IM (Completed)

## 2020-01-23 NOTE — Assessment & Plan Note (Signed)
Ordered first mammogram and pt will schedule  Enc regular self breast exams

## 2020-01-23 NOTE — Assessment & Plan Note (Signed)
Routine exam w/o abn Does not need contraception  May be starting some peri menopausal menses changes  Pap done-pend result

## 2020-01-23 NOTE — Assessment & Plan Note (Signed)
Pt tends to have one migraine per mo early in menses that can be severe Will try imitrex 100 mg for rescue  Discussed dosing and info given on this medication  Good habits for headache prevention  inst to update if this does not help

## 2020-01-23 NOTE — Patient Instructions (Signed)
Take care of yourself   Call the number at [place] to schedule your mammogram  Tetanus shot today  Wear sunscreen   Labs today

## 2020-01-24 LAB — CYTOLOGY - PAP
Adequacy: ABSENT
Diagnosis: NEGATIVE

## 2020-03-20 ENCOUNTER — Encounter: Payer: Self-pay | Admitting: Family Medicine

## 2020-05-14 ENCOUNTER — Ambulatory Visit
Admission: RE | Admit: 2020-05-14 | Discharge: 2020-05-14 | Disposition: A | Discharge status: Home / Self Care | Payer: BLUE CROSS/BLUE SHIELD | Source: Ambulatory Visit | Attending: Family Medicine | Admitting: Family Medicine

## 2020-05-14 DIAGNOSIS — Z1231 Encounter for screening mammogram for malignant neoplasm of breast: Secondary | ICD-10-CM | POA: Diagnosis not present

## 2020-05-20 ENCOUNTER — Encounter: Payer: Self-pay | Admitting: Family Medicine

## 2020-05-22 NOTE — Telephone Encounter (Signed)
Turned info into a phone note on pt's chart (left VM requesting mom to call back)   FYI once pt turns 15 mychart is disabled until they are 20 and can re-enroll themselves, that's why mom can get into pt's mychart anymore

## 2020-07-07 ENCOUNTER — Encounter: Payer: Self-pay | Admitting: Family Medicine

## 2020-09-26 ENCOUNTER — Telehealth: Payer: BLUE CROSS/BLUE SHIELD | Admitting: Physician Assistant

## 2020-09-26 DIAGNOSIS — B3731 Acute candidiasis of vulva and vagina: Secondary | ICD-10-CM

## 2020-09-26 DIAGNOSIS — B373 Candidiasis of vulva and vagina: Secondary | ICD-10-CM

## 2020-09-26 MED ORDER — FLUCONAZOLE 150 MG PO TABS: 150.0000 mg | ORAL_TABLET | Freq: Once | ORAL | 0 refills | Status: AC

## 2020-09-26 NOTE — Progress Notes (Signed)
We are sorry that you are not feeling well. Here is how we plan to help! Based on what you shared with me it looks like you: May have a yeast vaginosis  Vaginosis is an inflammation of the vagina that can result in discharge, itching and pain. The cause is usually a change in the normal balance of vaginal bacteria or an infection. Vaginosis can also result from reduced estrogen levels after menopause.  The most common causes of vaginosis are:   Bacterial vaginosis which results from an overgrowth of one on several organisms that are normally present in your vagina.   Yeast infections which are caused by a naturally occurring fungus called candida.   Vaginal atrophy (atrophic vaginosis) which results from the thinning of the vagina from reduced estrogen levels after menopause.   Trichomoniasis which is caused by a parasite and is commonly transmitted by sexual intercourse.  Factors that increase your risk of developing vaginosis include: Marland Kitchen Medications, such as antibiotics and steroids . Uncontrolled diabetes . Use of hygiene products such as bubble bath, vaginal spray or vaginal deodorant . Douching . Wearing damp or tight-fitting clothing . Using an intrauterine device (IUD) for birth control . Hormonal changes, such as those associated with pregnancy, birth control pills or menopause . Sexual activity . Having a sexually transmitted infection  Your treatment plan is A single Diflucan (fluconazole) 150mg  tablet once.  I have electronically sent this prescription into the pharmacy that you have chosen.   Please schedule an in-person visit with Dr. if your symptoms fail to improve. You should have a physical exam if this is the case.   Be sure to take all of the medication as directed. Stop taking any medication if you develop a rash, tongue swelling or shortness of breath. Mothers who are breast feeding should consider pumping and discarding their breast milk while on these  antibiotics. However, there is no consensus that infant exposure at these doses would be harmful.  Remember that medication creams can weaken latex condoms. Felicia Ibarra   HOME CARE:  Good hygiene may prevent some types of vaginosis from recurring and may relieve some symptoms:  . Avoid baths, hot tubs and whirlpool spas. Rinse soap from your outer genital area after a shower, and dry the area well to prevent irritation. Don't use scented or harsh soaps, such as those with deodorant or antibacterial action. Marland Kitchen Avoid irritants. These include scented tampons and pads. . Wipe from front to back after using the toilet. Doing so avoids spreading fecal bacteria to your vagina.  Other things that may help prevent vaginosis include:  Marland Kitchen Don't douche. Your vagina doesn't require cleansing other than normal bathing. Repetitive douching disrupts the normal organisms that reside in the vagina and can actually increase your risk of vaginal infection. Douching won't clear up a vaginal infection. . Use a latex condom. Both female and female latex condoms may help you avoid infections spread by sexual contact. . Wear cotton underwear. Also wear pantyhose with a cotton crotch. If you feel comfortable without it, skip wearing underwear to bed. Yeast thrives in Marland Kitchen Your symptoms should improve in the next day or two.  GET HELP RIGHT AWAY IF:  . You have pain in your lower abdomen ( pelvic area or over your ovaries) . You develop nausea or vomiting . You develop a fever . Your discharge changes or worsens . You have persistent pain with intercourse . You develop shortness of breath, a rapid pulse,  or you faint.  These symptoms could be signs of problems or infections that need to be evaluated by a medical provider now.  MAKE SURE YOU    Understand these instructions.  Will watch your condition.  Will get help right away if you are not doing well or get worse.  Your e-visit answers were reviewed  by a board certified advanced clinical practitioner to complete your personal care plan. Depending upon the condition, your plan could have included both over the counter or prescription medications. Please review your pharmacy choice to make sure that you have choses a pharmacy that is open for you to pick up any needed prescription, Your safety is important to [place]. If you have drug allergies check your prescription carefully.   You can use MyChart to ask questions about today's visit, request a non-urgent call back, or ask for a work or school excuse for 24 hours related to this e-Visit. If it has been greater than 24 hours you will need to follow up with your provider, or enter a new e-Visit to address those concerns. You will get a MyChart message within the next two days asking about your experience. I hope that your e-visit has been valuable and will speed your recovery.  Greater than 5 minutes, yet less than 10 minutes of time have been spent researching, coordinating and implementing care for this patient today.

## 2020-09-30 ENCOUNTER — Telehealth: Payer: Self-pay

## 2020-09-30 NOTE — Telephone Encounter (Signed)
[facility] Night - Client TELEPHONE ADVICE RECORD AccessNurse Patient Name: Felicia Ibarra Gender: Female DOB: 02/16/1976 Age: 44 Y 3 M 24 D Return Phone Number: [phone] (Primary), [phone] (Secondary) Address[place] [address] Client [place] [facility] Night - Client Client Site [facility] [place] [facility] - Night Physician [place], Idamae Schuller - MD Contact Type Call Who Is Calling Patient / Member / Family / Caregiver Call Type Triage / Clinical Relationship To Patient Self Return Phone Number [phone] (Primary) Chief Complaint Vaginal Discharge Reason for Call Symptomatic / Request for Health Information Initial Comment Caller states, on rx for yeast infection, no improvement. Wanting an appt or talk to someone. Translation No Nurse Assessment Nurse: Alexander Mt, RN, Nicholaus Bloom Date/Time (Eastern Time): 09/28/2020 10:40:18 AM Confirm and document reason for call. If symptomatic, describe symptoms. ---Caller states she took fluconazole for yeast infection on thursday at 4pm no improvement. States it is not getting any worse. Symptoms on the left labia is burning. No painful urination. No discharge or odor. States slight redness. Does the patient have any new or worsening symptoms? ---Yes Will a triage be completed? ---Yes Related visit to physician within the last 2 weeks? ---Yes Does the PT have any chronic conditions? (i.e. diabetes, asthma, this includes High risk factors for pregnancy, etc.) ---No Is the patient pregnant or possibly pregnant? (Ask all females between the ages of 12-55) ---No Is this a behavioral health or substance abuse call? ---No Guidelines Guideline Title Affirmed Question Affirmed Notes Nurse Date/Time (Eastern Time) Vulvar Symptoms All other vulvar symptoms (Exception: feels like prior yeast infection, minor abrasion, mild rash < 24 hour duration, mild itching) [place], RN, Nicholaus Bloom  09/28/2020 10:42:38 AM Disp. Time Lamount Cohen Time) Disposition Final User 09/28/2020 10:45:55 AM See PCP within 2 Weeks Yes Alexander Mt, RN, Erasmo Score NOTE: All timestamps contained within this report are represented as [place] Standard Time. CONFIDENTIALTY NOTICE: This fax transmission is intended only for the addressee. It contains information that is legally privileged, confidential or otherwise protected from use or disclosure. If you are not the intended recipient, you are strictly prohibited from reviewing, disclosing, copying using or disseminating any of this information or taking any action in reliance on or regarding this information. If you have received this fax in error, please notify us immediately by telephone so that we can arrange for its return to [place]. Phone: [phone], Toll-Free: [phone], Fax: [phone] Page: 2 of 2 Call Id: [id number] Caller Disagree/Comply Comply Caller Understands Yes PreDisposition Did not know what to do Care Advice Given Per Guideline SEE PCP WITHIN 2 WEEKS: * You need to be seen for this ongoing problem within the next 2 weeks. * PCP VISIT: Call your doctor (or NP/PA) during regular office hours and make an appointment. GENITAL HYGIENE: * Keep your genital area clean. Use mild soaps, but do not use them on your vulva. Wash the vulva area only with water. Gently pat the vulva area dry after washing. * Keep your genital area dry. Wear cotton underwear or underwear with a cotton crotch. * Use unscented 100% cotton menstrual pads. * Use adequate lubrication for sexual intercourse. * DO NOT douche. * DO NOT use feminine hygiene products, perfumed soaps. CALL BACK IF: * Rash spreads or becomes worse * Rash lasts over 1 or 2 days * Fever occurs * You become worse CARE ADVICE given per Vulvar Symptoms (Adult) guideline.

## 2020-09-30 NOTE — Telephone Encounter (Signed)
Please schedule in office visit with first available

## 2020-09-30 NOTE — Telephone Encounter (Signed)
Left VM requesting pt to call the office back 

## 2020-09-30 NOTE — Telephone Encounter (Signed)
Pt had an E-visit on 11/11 and was prescribed fluconazole.

## 2020-10-01 ENCOUNTER — Other Ambulatory Visit: Payer: Self-pay

## 2020-10-01 ENCOUNTER — Encounter: Payer: Self-pay | Admitting: Primary Care

## 2020-10-01 ENCOUNTER — Ambulatory Visit (INDEPENDENT_AMBULATORY_CARE_PROVIDER_SITE_OTHER): Payer: BLUE CROSS/BLUE SHIELD | Admitting: Primary Care

## 2020-10-01 VITALS — BP 124/62 | HR 100 | Temp 98.1°F | Ht 64.5 in | Wt 196.0 lb

## 2020-10-01 DIAGNOSIS — N898 Other specified noninflammatory disorders of vagina: Secondary | ICD-10-CM | POA: Insufficient documentation

## 2020-10-01 DIAGNOSIS — N949 Unspecified condition associated with female genital organs and menstrual cycle: Secondary | ICD-10-CM | POA: Diagnosis not present

## 2020-10-01 LAB — POC URINALSYSI DIPSTICK (AUTOMATED)
Glucose, UA: NEGATIVE
Ketones, UA: POSITIVE
Leukocytes, UA: NEGATIVE
Nitrite, UA: NEGATIVE
Protein, UA: POSITIVE — AB
Spec Grav, UA: >=1.03 — AB (ref 1.010–1.025)
Urobilinogen, UA: 0.2 E.U./dL
pH, UA: 6 (ref 5.0–8.0)

## 2020-10-01 NOTE — Assessment & Plan Note (Signed)
Acute for the last week, little improvement after two fluconazole tablets within three days.  Exam today without obvious cause. No rashes or discharge other than her regular menses. Wet prep completed today, await results.   Discussed use of OTC Cortisone cream BID x 1 week prn. She kindly declines STD testing when offered. UA today with 3+ blood, otherwise negative. She did start menses today.  Will await wet prep results.

## 2020-10-01 NOTE — Progress Notes (Signed)
Subjective:    Patient ID: Felicia Ibarra, female    DOB: November 28, 1975, 44 y.o.   MRN: [id number]  HPI  This visit occurred during the SARS-CoV-2 public health emergency.  Safety protocols were in place, including screening questions prior to the visit, additional usage of staff PPE, and extensive cleaning of exam room while observing appropriate contact time as indicated for disinfecting solutions.   Felicia Ibarra is a 44 year old female patient of Dr. Milinda Antis with a history of back pain and migraines who presents today with a chief complaint of vaginal itching and burning.   She completed an E-Visit on 09/26/20 for symptoms of vaginal itching and left labial burning. She was prescribed two fluconazole tablets for which she took three days apart, last dose two days ago. Since then she's noticed a slight improvement in itching, but continues to notice burning to the left labia.   She denies vaginal discharge, dysuria, hematuria, pelvic pain, urinary frequency. She's applied vagisil topically without improvement. She is in a monogamous relationship with her husband, denies concerns for STI's. She is scheduled to start menses today.   Review of Systems  Constitutional: Negative for fever.  Gastrointestinal: Negative for abdominal pain.  Genitourinary: Negative for dysuria, frequency, pelvic pain, urgency and vaginal discharge.       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  Tobacco Use   Smoking status: Never Smoker   Smokeless tobacco: Never Used  Substance and Sexual Activity   Alcohol use: No    Alcohol/week: 0.0 standard drinks   Drug use: No    Sexual activity: Not on file  Other Topics Concern   Not on file  Social History Narrative   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   G1P1   Social Determinants of Health   Financial Resource Strain:    Difficulty of Paying Living Expenses: Not on file  Food Insecurity:    Worried About Programme researcher, broadcasting/film/video in the Last Year: Not on file   [employer] of Food in the Last Year: Not on file  Transportation Needs:    Lack of Transportation (Medical): Not on file   Lack of Transportation (Non-Medical): Not on file  Physical Activity:    Days of Exercise per Week: Not on file   Minutes of Exercise per Session: Not on file  Stress:    Feeling of Stress : Not on file  Social Connections:    Frequency of Communication with Friends and Family: Not on file   Frequency of Social Gatherings with Friends and Family: Not on file   Attends Religious Services: Not on file   Active Member of Clubs or Organizations: Not on file   Attends Banker Meetings: Not on file   Marital Status: Not on file  Intimate Partner Violence:    Fear of Current or Ex-Partner: Not on file   Emotionally Abused: Not on file   Physically Abused: Not on file   Sexually Abused: Not on file    Past  Surgical History:  Procedure Laterality Date   CESAREAN SECTION  2007    Family History  Problem Relation Age of Onset   Diabetes Mother    Hypertension Mother    Hypothyroidism Mother    Cancer Father        prostate   Heart disease Maternal Grandfather        >70   Heart disease Paternal Grandfather        > 29   Hyperlipidemia Other    Hypothyroidism Other    Hypothyroidism Other    Sudden Cardiac Death Maternal Uncle    Atrial fibrillation Maternal Grandmother    Breast cancer Neg Hx     Allergies  Allergen Reactions   Amoxicillin    Cefaclor     REACTION: hives   Ibuprofen     REACTION: hives    Current Outpatient Medications on File  Prior to Visit  Medication Sig Dispense Refill   Multiple Vitamin (MULTIVITAMIN) tablet Take 1 tablet by mouth daily.     SUMAtriptan (IMITREX) 100 MG tablet Take 1 tablet (100 mg total) by mouth once for 1 dose. May repeat in 2 hours if headache persists or recurs. 10 tablet 3   No current facility-administered medications on file prior to visit.    BP 124/62    Pulse 100    Temp 98.1 F (36.7 C) (Temporal)    Ht 5' 4.5" (1.638 m)    Wt 196 lb (88.9 kg)    SpO2 100%    BMI 33.12 kg/m    Objective:   Physical Exam Abdominal:     General: Abdomen is flat.  Genitourinary:    Labia:        Right: No rash, tenderness or lesion.        Left: No rash, tenderness or lesion.      Vagina: No vaginal discharge or erythema.     Cervix: Cervical bleeding present. No discharge.     Uterus: Not tender.      Comments: Mild menstrual bleeding from cervix.  Neurological:     Mental Status: She is alert.            Assessment & Plan:

## 2020-10-01 NOTE — Telephone Encounter (Signed)
Pt called back and saw Jae Dire, NP today

## 2020-10-01 NOTE — Patient Instructions (Signed)
We will be in touch once we receive your vaginal swab results.   You can apply a small amount of hydrocortisone cream to the external vagina twice daily for about one week as needed for burning and itching.  It was a pleasure to see you today!

## 2020-10-02 LAB — WET PREP BY MOLECULAR PROBE
Candida species: NOT DETECTED
Gardnerella vaginalis: NOT DETECTED
MICRO NUMBER:: 11209643
SPECIMEN QUALITY:: ADEQUATE
Trichomonas vaginosis: NOT DETECTED

## 2020-10-23 ENCOUNTER — Encounter: Payer: Self-pay | Admitting: Family Medicine

## 2020-12-22 ENCOUNTER — Encounter: Payer: Self-pay | Admitting: Family Medicine

## 2020-12-23 NOTE — Telephone Encounter (Signed)
[facility] Night - Client Nonclinical Telephone Record AccessNurse Client [facility] Night - Client Client Site [facility] - Night Physician Roxy Manns - MD Contact Type Call Who Is Calling Patient / Member / Family / Caregiver Caller Name deshante cassell Caller Phone Number [phone] Patient Name Felicia Ibarra Patient DOB 1975-11-24 Call Type Message Only Information Provided Reason for Call Request to [facility] Appointment Initial Comment Caller has an appt on 2/08 at 8am but needs to cancel. Disp. Time Disposition Final User 12/22/2020 10:25:03 AM General Information Provided Yes Gerrie Nordmann Call Closed By: Gerrie Nordmann Transaction Date/Time: 12/22/2020 10:23:26 AM (ET)

## 2020-12-24 ENCOUNTER — Ambulatory Visit: Payer: BLUE CROSS/BLUE SHIELD | Admitting: Family Medicine

## 2021-01-14 ENCOUNTER — Ambulatory Visit: Payer: BLUE CROSS/BLUE SHIELD | Admitting: Family Medicine

## 2021-01-15 ENCOUNTER — Other Ambulatory Visit: Payer: Self-pay

## 2021-01-15 ENCOUNTER — Ambulatory Visit (INDEPENDENT_AMBULATORY_CARE_PROVIDER_SITE_OTHER): Payer: BLUE CROSS/BLUE SHIELD | Admitting: Family Medicine

## 2021-01-15 ENCOUNTER — Encounter: Payer: Self-pay | Admitting: Family Medicine

## 2021-01-15 VITALS — BP 128/76 | HR 101 | Temp 97.1°F | Ht 64.5 in | Wt 191.2 lb

## 2021-01-15 DIAGNOSIS — N898 Other specified noninflammatory disorders of vagina: Secondary | ICD-10-CM | POA: Diagnosis not present

## 2021-01-15 LAB — POCT WET PREP (WET MOUNT): Trichomonas Wet Prep HPF POC: ABSENT

## 2021-01-15 MED ORDER — METRONIDAZOLE 0.75 % VA GEL: 1.0000 | Freq: Every day | VAGINAL | 0 refills | Status: DC

## 2021-01-15 NOTE — Progress Notes (Signed)
Subjective:    Patient ID: Felicia Ibarra, female    DOB: 03/04/1976, 45 y.o.   MRN: [id number]  This visit occurred during the SARS-CoV-2 public health emergency.  Safety protocols were in place, including screening questions prior to the visit, additional usage of staff PPE, and extensive cleaning of exam room while observing appropriate contact time as indicated for disinfecting solutions.    HPI Pt presents for c/o vaginal discomfort   Wt Readings from Last 3 Encounters:  01/15/21 191 lb 3 oz (86.7 kg)  10/01/20 196 lb (88.9 kg)  01/23/20 184 lb 4 oz (83.6 kg)   32.31 kg/m  Stinging -labial areas ( varies depending on time of the month)  Perhaps worse right before period  Some pelvic pressure on and off  No discharge or odor  Some vaginal dryness   No worries about STDs   Last spring she skipped menses for 3 mo  Now less irregular but still comes early/late   Her menses stops and starts a bit during the flow- but not heavy   No pain to urinate   No vaginal products  Has not used lubricants  Is sexually active  Not painful with intercourse but does feel dry  No baths   Tries to avoid soap/detergent   Had e visit in November  Thought yeast infection  tx with diflucan-no imp  Then saw Jae Dire and she treated with otc cortisone cream    PAP nl 3/21  Wet prep Results for orders placed or performed in visit on 01/15/21  POCT Wet Prep [employer] [facility])  Result Value Ref Range   Source Wet Prep POC vaginal    WBC, Wet Prep HPF POC mod    Bacteria Wet Prep HPF POC Moderate (A) Few   BACTERIA WET PREP MORPHOLOGY POC     Clue Cells Wet Prep HPF POC Moderate (A) None   Clue Cells Wet Prep Whiff POC     Yeast Wet Prep HPF POC None None   KOH Wet Prep POC None None   Trichomonas Wet Prep HPF POC Absent Absent    Patient Active Problem List   Diagnosis Date Noted  . Vaginal itching 10/01/2020  . Visit for routine gyn exam 01/23/2020  . Screening mammogram, encounter  for 01/23/2020  . Migraines 09/03/2016  . Routine general medical examination at a health care facility 03/15/2012  . BACK PAIN 07/26/2009  . Obesity (BMI 30-39.9) 02/09/2008  . 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 Low back pain, episodic    Past Surgical History:  Procedure Laterality Date  . CESAREAN SECTION  2007   Social History   Tobacco Use  . Smoking status: Never Smoker  . Smokeless tobacco: Never Used  Substance Use Topics  . Alcohol use: No    Alcohol/week: 0.0 standard drinks  . Drug use: No   Family History  Problem Relation Age of Onset  . Diabetes Mother   . Hypertension Mother   . Hypothyroidism Mother   . Cancer Father        prostate  . Heart disease Maternal Grandfather        >70  . Heart disease Paternal Grandfather        > 70  . Hyperlipidemia Other   . Hypothyroidism Other   . Hypothyroidism Other   . Sudden Cardiac Death Maternal Uncle   . Atrial fibrillation Maternal Grandmother   . Breast cancer Neg Hx  Allergies  Allergen Reactions  . Amoxicillin   . Cefaclor     REACTION: hives  . Ibuprofen     REACTION: hives   Current Outpatient Medications on File Prior to Visit  Medication Sig Dispense Refill  . Multiple Vitamin (MULTIVITAMIN) tablet Take 1 tablet by mouth daily.    . SUMAtriptan (IMITREX) 100 MG tablet Take 1 tablet (100 mg total) by mouth once for 1 dose. May repeat in 2 hours if headache persists or recurs. 10 tablet 3   No current facility-administered medications on file prior to visit.    Review of Systems  Constitutional: Negative for activity change, appetite change, fatigue, fever and unexpected weight change.  HENT: Negative for congestion, ear pain, rhinorrhea, sinus pressure and sore throat.   Eyes: Negative for pain, redness and visual disturbance.  Respiratory: Negative for cough, shortness of breath and wheezing.   Cardiovascular: Negative for chest pain and palpitations.   Gastrointestinal: Negative for abdominal pain, blood in stool, constipation and diarrhea.  Endocrine: Negative for polydipsia and polyuria.  Genitourinary: Positive for menstrual problem and vaginal pain. Negative for dysuria, frequency, urgency, vaginal bleeding and vaginal discharge.  Musculoskeletal: Negative for arthralgias, back pain and myalgias.  Skin: Negative for pallor and rash.  Allergic/Immunologic: Negative for environmental allergies.  Neurological: Negative for dizziness, syncope and headaches.  Hematological: Negative for adenopathy. Does not bruise/bleed easily.  Psychiatric/Behavioral: Negative for decreased concentration and dysphoric mood. The patient is not nervous/anxious.        Objective:   Physical Exam Constitutional:      General: She is not in acute distress.    Appearance: Normal appearance. She is obese. She is not ill-appearing.  HENT:     Head: Normocephalic and atraumatic.  Eyes:     Conjunctiva/sclera: Conjunctivae normal.     Pupils: Pupils are equal, round, and reactive to light.  Cardiovascular:     Rate and Rhythm: Regular rhythm. Tachycardia present.     Heart sounds: Normal heart sounds.  Pulmonary:     Effort: Pulmonary effort is normal.     Breath sounds: Normal breath sounds.  Abdominal:     General: Abdomen is flat. Bowel sounds are normal. There is no distension.     Palpations: Abdomen is soft. There is no mass.     Tenderness: There is no abdominal tenderness. There is no right CVA tenderness or left CVA tenderness.     Hernia: No hernia is present.  Genitourinary:    Comments: Nl appearing externa genitalia  Scant cloudy vaginal d/c No lesions or tenderness  Wet prep obtained Musculoskeletal:     Cervical back: Neck supple.  Skin:    General: Skin is warm and dry.     Findings: No erythema, lesion or rash.  Neurological:     Mental Status: She is alert.  Psychiatric:        Mood and Affect: Mood normal.            Assessment & Plan:   Problem List Items Addressed This Visit      Musculoskeletal and Integument   Vaginal itching - Primary    Now more discomfort than itching  Some pelvic pressure also  Tends to come and go  Likely in perimenopause  Also uses condoms with spermicide Wet prep today shows clue cells  Adv to stop spermicide  Px metrogel vaginal to use qhs for 7d  Abstain for 7 d also  Update if not starting to improve in  a week or if worsening        Relevant Orders   POCT Wet Prep [facility]) (Completed)

## 2021-01-15 NOTE — Patient Instructions (Signed)
If you use condoms, spermicide can cause reactions in some people so change to no spermacide   Lubricant is ok - KY jelly   Use the metrogel vaginal and use as directed  After you finish if still symptomatic let us know

## 2021-01-15 NOTE — Assessment & Plan Note (Signed)
Now more discomfort than itching  Some pelvic pressure also  Tends to come and go  Likely in perimenopause  Also uses condoms with spermicide Wet prep today shows clue cells  Adv to stop spermicide  Px metrogel vaginal to use qhs for 7d  Abstain for 7 d also  Update if not starting to improve in a week or if worsening

## 2021-01-18 ENCOUNTER — Encounter: Payer: Self-pay | Admitting: Family Medicine

## 2021-01-18 DIAGNOSIS — N898 Other specified noninflammatory disorders of vagina: Secondary | ICD-10-CM

## 2021-01-18 DIAGNOSIS — Z8349 Family history of other endocrine, nutritional and metabolic diseases: Secondary | ICD-10-CM

## 2021-01-18 DIAGNOSIS — R5382 Chronic fatigue, unspecified: Secondary | ICD-10-CM

## 2021-01-21 ENCOUNTER — Other Ambulatory Visit: Payer: Self-pay

## 2021-01-21 ENCOUNTER — Ambulatory Visit (INDEPENDENT_AMBULATORY_CARE_PROVIDER_SITE_OTHER): Payer: BLUE CROSS/BLUE SHIELD | Admitting: Obstetrics

## 2021-01-21 ENCOUNTER — Other Ambulatory Visit (HOSPITAL_COMMUNITY)
Admission: RE | Admit: 2021-01-21 | Discharge: 2021-01-21 | Disposition: A | Discharge status: Home / Self Care | Payer: BLUE CROSS/BLUE SHIELD | Source: Ambulatory Visit | Attending: Obstetrics | Admitting: Obstetrics

## 2021-01-21 VITALS — BP 118/74 | Ht 65.0 in | Wt 191.0 lb

## 2021-01-21 DIAGNOSIS — N9089 Other specified noninflammatory disorders of vulva and perineum: Secondary | ICD-10-CM | POA: Diagnosis not present

## 2021-01-21 DIAGNOSIS — B9689 Other specified bacterial agents as the cause of diseases classified elsewhere: Secondary | ICD-10-CM | POA: Diagnosis not present

## 2021-01-21 DIAGNOSIS — N760 Acute vaginitis: Secondary | ICD-10-CM | POA: Insufficient documentation

## 2021-01-21 DIAGNOSIS — O3460 Maternal care for abnormality of vagina, unspecified trimester: Secondary | ICD-10-CM | POA: Diagnosis not present

## 2021-01-21 DIAGNOSIS — N898 Other specified noninflammatory disorders of vagina: Secondary | ICD-10-CM | POA: Diagnosis not present

## 2021-01-21 DIAGNOSIS — R5383 Other fatigue: Secondary | ICD-10-CM | POA: Insufficient documentation

## 2021-01-21 MED ORDER — NYSTATIN-TRIAMCINOLONE 100000-0.1 UNIT/GM-% EX CREA: 1.0000 "application " | TOPICAL_CREAM | Freq: Two times a day (BID) | CUTANEOUS | 0 refills | Status: DC

## 2021-01-21 NOTE — Progress Notes (Signed)
Obstetrics & Gynecology Office Visit   Chief Complaint:  Chief Complaint  Patient presents with  . Referral  . Vaginitis    History of Present Illness: Felicia Ibarra presents today with c/o labial and perineal irritation that has been bothering her for several weeks. 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 She has been to see her [facility] provider and was recently prescribed Metrogel for suspected BV. Felicia Ibarra is married and sexually active only with her husband. She continues to have regular cycles and uses condoms for contraception.  She denies any noticeable vaginal discharge. After using the Metrogel, she relates mild improvement, but is now again continuing to experience slight "stinging" around her vaginal and labia. She wears cotton underwear,, and has not changed her laundry detergent. She does not wear thongs.    Review of Systems:  Review of Systems  Constitutional: Negative.   HENT: Negative.   Eyes: Negative.   Cardiovascular: Negative.   Gastrointestinal: Negative.   Genitourinary: Negative.   Musculoskeletal: Negative.   Skin:       Irritation, perineal irritation  Neurological: Negative.   Endo/Heme/Allergies: Negative.   Psychiatric/Behavioral: Negative.      Past Medical History:  Past Medical History:  Diagnosis Date  . Low back pain, episodic     Past Surgical History:  Past Surgical History:  Procedure Laterality Date  . CESAREAN SECTION  2007    Gynecologic History: Patient's last menstrual period was 01/04/2021.  Obstetric History: No obstetric history on file.  Family History:  Family History  Problem Relation Age of Onset  . Diabetes Mother   . Hypertension Mother   . Hypothyroidism Mother   . Cancer Father        prostate  . Heart disease Maternal Grandfather        >70  . Heart disease Paternal Grandfather        > 70  . Hyperlipidemia Other   . Hypothyroidism Other   . Hypothyroidism Other   .  Sudden Cardiac Death Maternal Uncle   . Atrial fibrillation Maternal Grandmother   . Breast cancer Neg Hx     Social History:  Social History   Socioeconomic History  . Marital status: Married    Spouse name: Not on file  . Number of children: Not on file  . Years of education: Not on file  . Highest education level: Not on file  Occupational History  . 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 Smoking status: Never Smoker  . Smokeless tobacco: Never Used  Substance and Sexual Activity  . Alcohol use: No    Alcohol/week: 0.0 standard drinks  . Drug use: No  . Sexual activity: Not on file  Other Topics Concern  . 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 Amoxicillin   . Cefaclor     REACTION: hives  . Ibuprofen     REACTION: hives    Medications: Prior to Admission medications  Medication Sig Start Date End Date Taking? Authorizing Provider  nystatin-triamcinolone (MYCOLOG II) cream Apply 1 application topically 2 (two) times daily. 01/21/21  Yes Mirna Mires, CNM  metroNIDAZOLE (METROGEL VAGINAL) 0.75 % vaginal gel Place 1 Applicatorful vaginally at bedtime. 01/15/21   Tower, Audrie Gallus, MD  Multiple Vitamin (MULTIVITAMIN) tablet Take 1 tablet by mouth daily.    [provider]  SUMAtriptan (IMITREX) 100 MG tablet Take 1 tablet (100 mg total) by mouth once for 1 dose. May repeat in 2 hours if headache persists or recurs. 01/23/20 01/23/20  Judy Pimple, MD    Physical Exam Vitals:  Vitals:   01/21/21 1026  BP: 118/74   Patient's last menstrual period was  01/04/2021.  Physical Exam Constitutional:      Appearance: Normal appearance. She is obese.  HENT:     Head: Normocephalic and atraumatic.  Cardiovascular:     Rate and Rhythm: Normal rate.  Pulmonary:     Effort: Pulmonary effort is normal.     Breath sounds: Normal breath sounds.  Abdominal:     Palpations: Abdomen is soft.  Genitourinary:    Comments: Normal external genitalia. Pubic hair is short. No lesions or rashes, Skenes and Bartholins glands are not enlarged. No noticeable vaginal discharge noted. Normal vaginal rugae. Around the vagina, in the labial folds, the skin is pink to ruddy in color.  No tenderness to touch on examination. Musculoskeletal:        General: Normal range of motion.     Cervical back: Normal range of motion and neck supple.  Skin:    General: Skin is warm and dry.  Neurological:     General: No focal deficit present.     Mental Status: She is alert and oriented to person, place, and time.  Psychiatric:        Mood and Affect: Mood normal.        Behavior: Behavior normal.      Assessment: 45 y.o. No obstetric history on file. Perineal and labial irritation of unknown etiology   Plan: Problem List Items Addressed This Visit   None   Visit Diagnoses    Vaginal irritation    -  Primary   Relevant Medications   nystatin-triamcinolone (MYCOLOG II) cream   Other Relevant Orders   Cervicovaginal ancillary only   Vaginal abnormality in pregnancy       Labial irritation       Relevant Medications   nystatin-triamcinolone (MYCOLOG II) cream   Other Relevant Orders   Cervicovaginal ancillary only    We will review the results of the Aptima swab. In the meantime I have prescribed Mycolog cream that she is to apply sparingly.  Will follow up based on the lab results.  Mirna Mires, CNM  01/21/2021 5:00 PM

## 2021-01-21 NOTE — Addendum Note (Signed)
Addended by: Roxy Manns A on: 01/21/2021 05:21 PM   Modules accepted: Orders

## 2021-01-22 DIAGNOSIS — Z8349 Family history of other endocrine, nutritional and metabolic diseases: Secondary | ICD-10-CM | POA: Insufficient documentation

## 2021-01-22 NOTE — Addendum Note (Signed)
Addended by: Roxy Manns A on: 01/22/2021 09:21 AM   Modules accepted: Orders

## 2021-01-23 ENCOUNTER — Other Ambulatory Visit: Payer: Self-pay | Admitting: Obstetrics

## 2021-01-23 ENCOUNTER — Other Ambulatory Visit: Payer: Self-pay

## 2021-01-23 ENCOUNTER — Other Ambulatory Visit (INDEPENDENT_AMBULATORY_CARE_PROVIDER_SITE_OTHER): Payer: BLUE CROSS/BLUE SHIELD

## 2021-01-23 DIAGNOSIS — R5382 Chronic fatigue, unspecified: Secondary | ICD-10-CM | POA: Diagnosis not present

## 2021-01-23 DIAGNOSIS — N898 Other specified noninflammatory disorders of vagina: Secondary | ICD-10-CM

## 2021-01-23 DIAGNOSIS — N760 Acute vaginitis: Secondary | ICD-10-CM

## 2021-01-23 DIAGNOSIS — Z8349 Family history of other endocrine, nutritional and metabolic diseases: Secondary | ICD-10-CM

## 2021-01-23 DIAGNOSIS — B9689 Other specified bacterial agents as the cause of diseases classified elsewhere: Secondary | ICD-10-CM

## 2021-01-23 LAB — CERVICOVAGINAL ANCILLARY ONLY
Bacterial Vaginitis (gardnerella): POSITIVE — AB
Candida Glabrata: NEGATIVE
Candida Vaginitis: NEGATIVE
Comment: NEGATIVE
Comment: NEGATIVE
Comment: NEGATIVE

## 2021-01-23 LAB — TSH: TSH: 1.64 u[IU]/mL (ref 0.35–4.50)

## 2021-01-23 LAB — T4, FREE: Free T4: 1 ng/dL (ref 0.60–1.60)

## 2021-01-23 MED ORDER — METRONIDAZOLE 500 MG PO TABS: 500.0000 mg | ORAL_TABLET | Freq: Two times a day (BID) | ORAL | 0 refills | Status: AC

## 2021-01-23 NOTE — Progress Notes (Signed)
Aptima swab shows BV. Discussed with patient who has already trialed Metrogel. Oral Metronidazole RX sent to pharmacy for her. Mirna Mires, CNM  01/23/2021 11:36 AM

## 2021-01-23 NOTE — Progress Notes (Signed)
metronid

## 2021-01-27 LAB — T3, REVERSE: T3, Reverse: 18 ng/dL (ref 8–25)

## 2021-01-27 LAB — THYROID PEROXIDASE ANTIBODY: Thyroperoxidase Ab SerPl-aCnc: 1 IU/mL (ref <9)

## 2021-01-27 LAB — THYROGLOBULIN LEVEL: Thyroglobulin: 42.7 ng/mL — ABNORMAL HIGH

## 2021-01-27 LAB — T3: T3, Total: 111 ng/dL (ref 76–181)

## 2021-01-31 ENCOUNTER — Telehealth: Payer: Self-pay | Admitting: *Deleted

## 2021-01-31 NOTE — Telephone Encounter (Signed)
Sent mychart letting pt know Dr. Royden Purl comments. I will await her response

## 2021-01-31 NOTE — Telephone Encounter (Signed)
Sent message to Dr. Milinda Antis since she has been communicating with PCP via mychart regarding her issue she was just see for. Pt's message (2 parts says):    Thank you for reviewing my labs.  The Gyn office did an exam last week and found no abnormalities other than the presence of bacteria.  I was prescribed an oral antibiotic and have finished that treatment.  I have no idea if it worked as the symptoms I was having were present when I had previously test negative and are not the usual BV symptoms.  The stinging on the labia (towards the back) is still present as well as stinging on the back of my thighs.  A doctor friend suggest maybe it is a nerve thing related to my lower back.     (continued)  While that may be, it does not explain why I cannot lose much weight despite trying, nor why my pulse sometimes skyrockets to over 200 (measured with pulse ox) while just sitting, the fatigue, sleep disturbances or emotional issues, etc.  It was due to those other symptoms that I thought maybe it was thyroid or hormones, but it seems both have been ruled out.  I am at a loss and don't know what to do.  Any advice is appreciated.  A note - the labial stinging does not bother me unless I am sitting and the back of my thighs also sting in the same manner.   Will route to PCP for review

## 2021-01-31 NOTE — Telephone Encounter (Signed)
It would warrant re addressing with gyn to see if it is a nerve issue, we could also consider lumbar spine imaging followed by ortho eval.  Let her know that I am out of the office an unsure when returning quite yet. Can schedule f/u with me when I am back.  Thanks

## 2021-02-13 DIAGNOSIS — N898 Other specified noninflammatory disorders of vagina: Secondary | ICD-10-CM | POA: Diagnosis not present

## 2021-03-13 ENCOUNTER — Encounter: Payer: Self-pay | Admitting: Family Medicine

## 2021-03-26 IMAGING — MG DIGITAL SCREENING BILAT W/ TOMO W/ CAD
8 series · 8 of 24 positions shown · non-contrast
Comparison: None.

CLINICAL DATA: Screening.

EXAM:
DIGITAL SCREENING BILATERAL MAMMOGRAM WITH TOMO AND CAD

[L CC synth-2D]
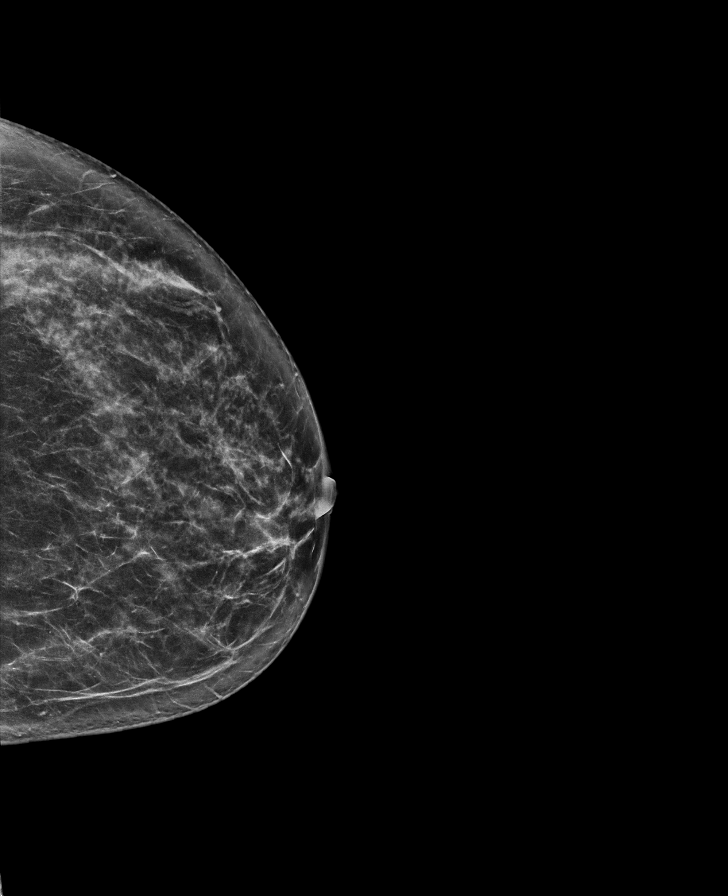

[L MLO synth-2D]
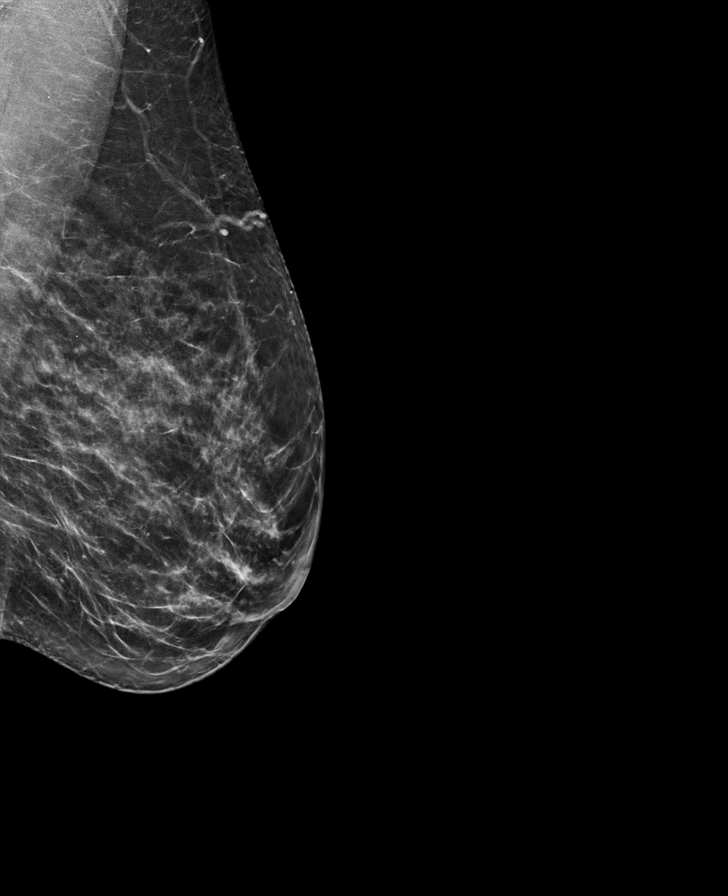

[R CC synth-2D]
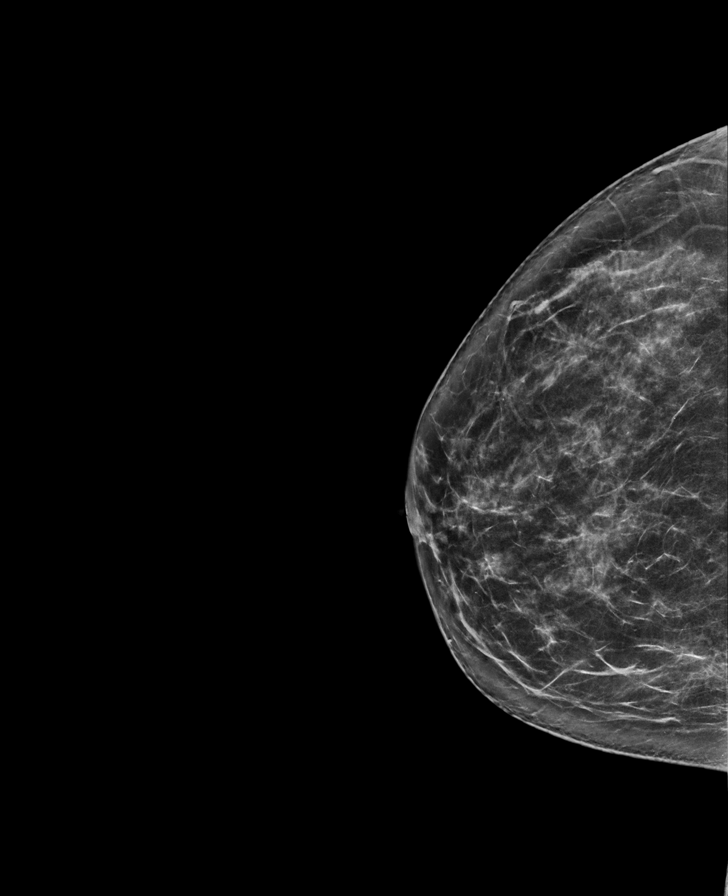

[R MLO synth-2D]
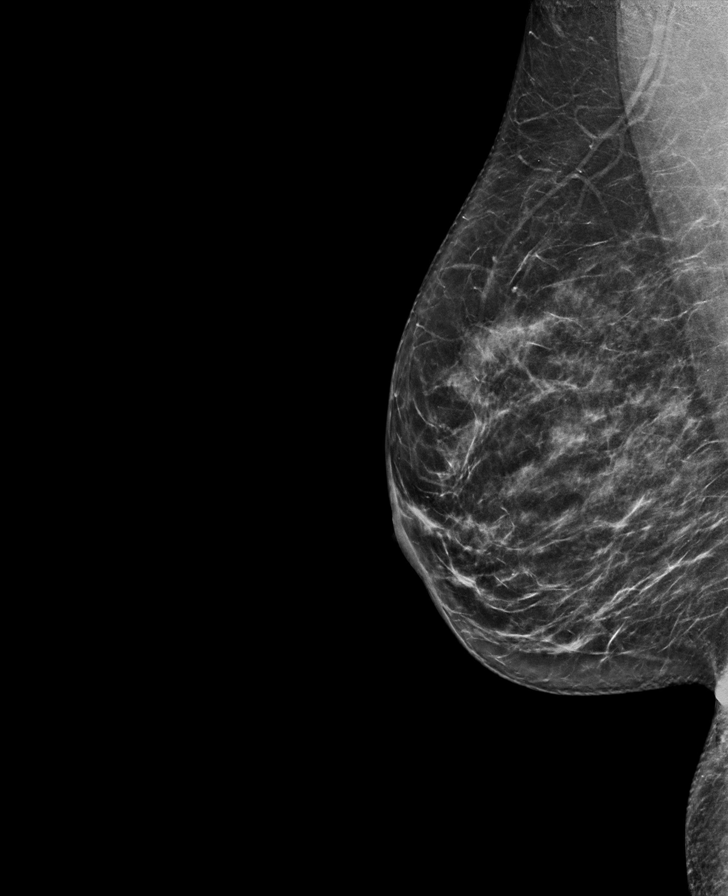

[L CC tomo · tomo slice 35/68.0]
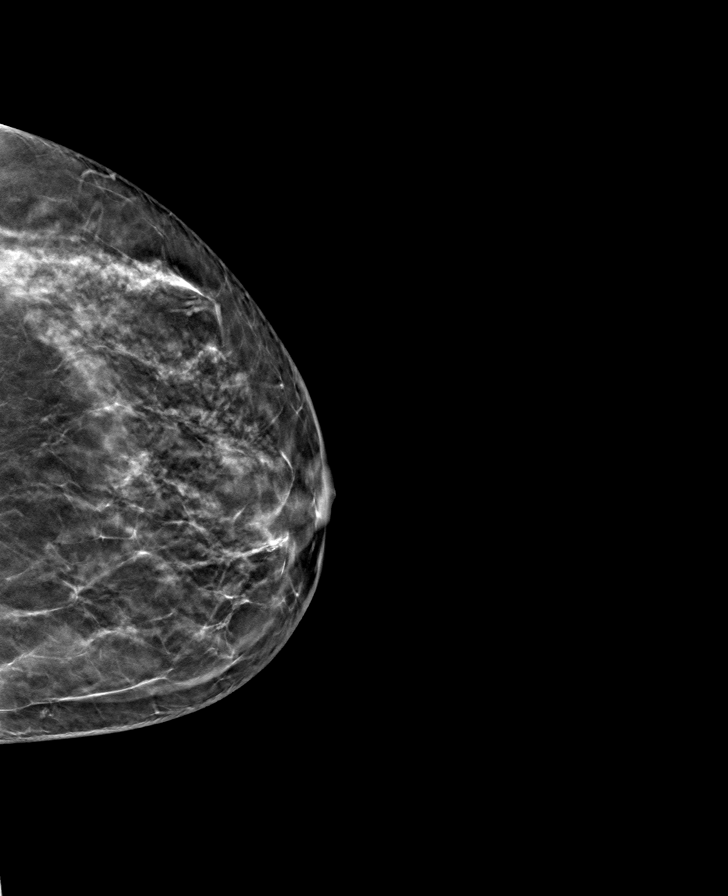

[R MLO tomo · tomo slice 37/73.0]
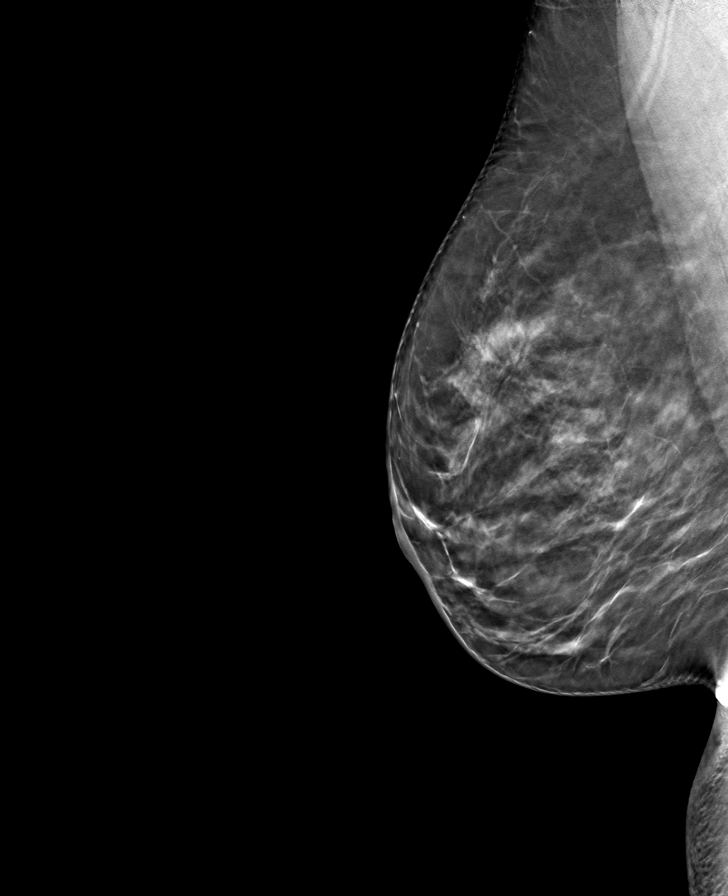

[R CC tomo · tomo slice 37/73.0]
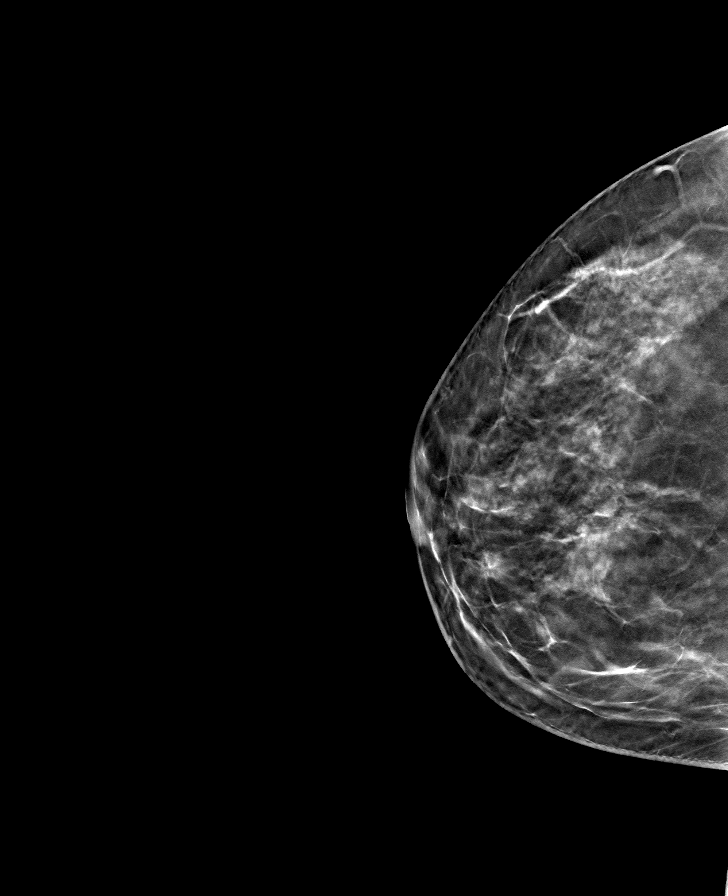

[L MLO tomo · tomo slice 37/72.0]
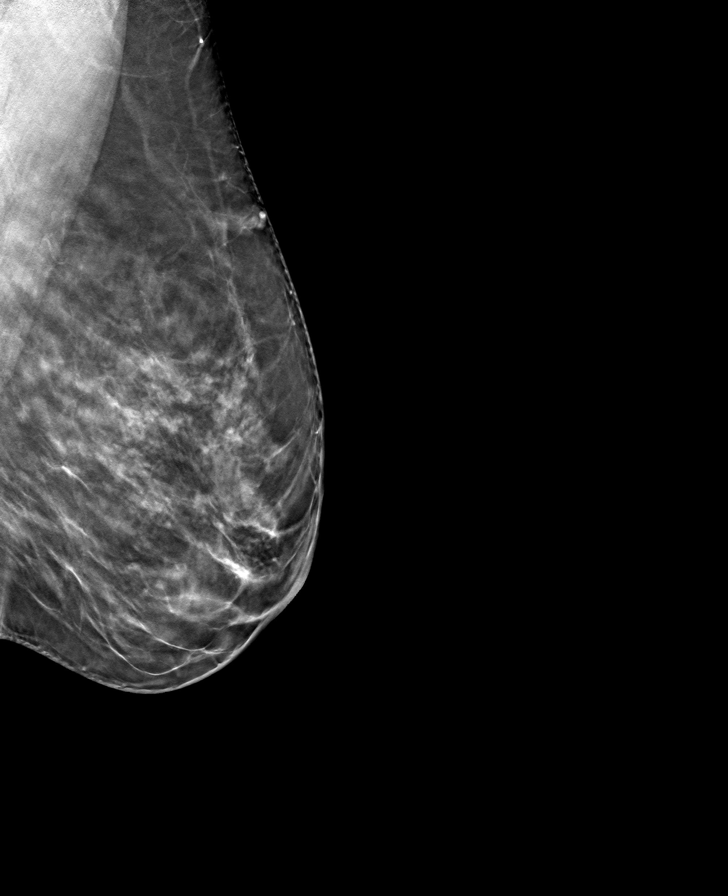

[8 of 24 positions shown; findings below may reference images not displayed]

ACR Breast Density Category c: The breast tissue is heterogeneously
dense, which may obscure small masses
FINDINGS: There are no findings suspicious for malignancy. Images were
processed with CAD.
IMPRESSION: No mammographic evidence of malignancy. A result letter of this
screening mammogram will be mailed directly to the patient.

RECOMMENDATION:
Screening mammogram in one year. (Code:EM-2-IHY)

BI-RADS CATEGORY  1: Negative.

## 2021-04-18 ENCOUNTER — Other Ambulatory Visit: Payer: Self-pay | Admitting: Family Medicine

## 2021-04-18 MED ORDER — SUMATRIPTAN SUCCINATE 100 MG PO TABS: ORAL_TABLET | ORAL | 11 refills | Status: DC

## 2021-04-18 NOTE — Telephone Encounter (Signed)
Last filled on 01/23/20 #10 tabs with 3 refills, last OV was on 02/04/21 for vaginal issues, please advise

## 2021-05-02 ENCOUNTER — Encounter: Payer: Self-pay | Admitting: Emergency Medicine

## 2021-05-02 ENCOUNTER — Ambulatory Visit
Admission: EM | Admit: 2021-05-02 | Discharge: 2021-05-02 | Disposition: A | Discharge status: Home / Self Care | Payer: BLUE CROSS/BLUE SHIELD

## 2021-05-02 ENCOUNTER — Other Ambulatory Visit: Payer: Self-pay

## 2021-05-02 DIAGNOSIS — G43909 Migraine, unspecified, not intractable, without status migrainosus: Secondary | ICD-10-CM | POA: Diagnosis not present

## 2021-05-02 MED ORDER — DEXAMETHASONE SODIUM PHOSPHATE 10 MG/ML IJ SOLN
10.0000 mg | Freq: Once | INTRAMUSCULAR | Status: AC
  Administered 2021-05-02: 10 mg via INTRAMUSCULAR

## 2021-05-02 NOTE — ED Provider Notes (Signed)
Renaldo Fiddler    CSN: [id number] Arrival date & time: 05/02/21  1507      History   Chief Complaint Chief Complaint  Patient presents with   Migraine     HPI Felicia Ibarra is a 45 y.o. female.  Patient presents with 2-day history of migraine headache.  Her current headache is similar to her previous migraine headaches.  She reports associated nausea but no vomiting.  She denies fever, chills, dizziness, weakness, numbness, or other symptoms.  Treatment attempted at home with Imitrex once on Wednesday and once yesterday; had moderate temporary relief.  Her medical history includes obesity, low back pain, migraine headaches.  The history is provided by the patient and medical records.   Past Medical History:  Diagnosis Date   Low back pain, episodic    Migraine 2018    Patient Active Problem List   Diagnosis Date Noted   Family history of Hashimoto thyroiditis 01/22/2021   Fatigue 01/21/2021   Vaginal irritation 10/01/2020   Visit for routine gyn exam 01/23/2020   Screening mammogram, encounter for 01/23/2020   Migraines 09/03/2016   Routine general medical examination at a health care facility 03/15/2012   BACK PAIN 07/26/2009   Obesity (BMI 30-39.9) 02/09/2008   PLANTAR WART 08/22/2007    Past Surgical History:  Procedure Laterality Date   CESAREAN SECTION  2007    OB History   No obstetric history on file.      Home Medications    Prior to Admission medications   Medication Sig Start Date End Date Taking? Authorizing Provider  naproxen (NAPROSYN) 250 MG tablet Take 250 mg by mouth 2 (two) times daily with a meal.   Yes [provider]  SUMAtriptan (IMITREX) 100 MG tablet Take one tablet once, may repeat in 2 hours if headache persists or recurs. 04/18/21  Yes Tower, Audrie Gallus, MD  metroNIDAZOLE (METROGEL VAGINAL) 0.75 % vaginal gel Place 1 Applicatorful vaginally at bedtime. 01/15/21   Tower, Audrie Gallus, MD  nystatin-triamcinolone (MYCOLOG II)  cream Apply 1 application topically 2 (two) times daily. 01/21/21   Mirna Mires, CNM    Family History Family History  Problem Relation Age of Onset   Diabetes Mother    Hypertension Mother    Hypothyroidism Mother    Cancer Father        prostate   Heart disease Maternal Grandfather        >70   Heart disease Paternal Grandfather        > 63   Hyperlipidemia Other    Hypothyroidism Other    Hypothyroidism Other    Sudden Cardiac Death Maternal Uncle    Atrial fibrillation Maternal Grandmother    Breast cancer Neg Hx     Social History Social History   Tobacco Use   Smoking status: Never   Smokeless tobacco: Never  Substance Use Topics   Alcohol use: No    Alcohol/week: 0.0 standard drinks   Drug use: No     Allergies   Amoxicillin, Cefaclor, and Ibuprofen   Review of Systems Review of Systems  Constitutional:  Negative for chills and fever.  Eyes:  Negative for photophobia and visual disturbance.  Respiratory:  Negative for cough and shortness of breath.   Cardiovascular:  Negative for chest pain and palpitations.  Gastrointestinal:  Positive for nausea. Negative for abdominal pain, diarrhea and vomiting.  Skin:  Negative for color change and rash.  Neurological:  Positive for headaches. Negative for  dizziness, seizures, syncope, weakness and numbness.  All other systems reviewed and are negative.   Physical Exam Triage Vital Signs ED Triage Vitals  Enc Vitals Group     BP      Pulse      Resp      Temp      Temp src      SpO2      Weight      Height      Head Circumference      Peak Flow      Pain Score      Pain Loc      Pain Edu?      Excl. in GC?    No data found.  Updated Vital Signs BP 127/85 (BP Location: Left Arm)   Pulse 94   Temp 99 F (37.2 C) (Oral)   Resp 14   LMP 04/10/2021 (Exact Date)   SpO2 96%   Visual Acuity Right Eye Distance:   Left Eye Distance:   Bilateral Distance:    Right Eye Near:   Left Eye Near:     Bilateral Near:     Physical Exam Vitals and nursing note reviewed.  Constitutional:      General: She is not in acute distress.    Appearance: She is well-developed. She is not ill-appearing.  HENT:     Head: Normocephalic and atraumatic.     Mouth/Throat:     Mouth: Mucous membranes are moist.  Eyes:     Extraocular Movements: Extraocular movements intact.     Conjunctiva/sclera: Conjunctivae normal.     Pupils: Pupils are equal, round, and reactive to light.  Cardiovascular:     Rate and Rhythm: Normal rate and regular rhythm.     Heart sounds: Normal heart sounds.  Pulmonary:     Effort: Pulmonary effort is normal. No respiratory distress.     Breath sounds: Normal breath sounds.  Abdominal:     Palpations: Abdomen is soft.     Tenderness: There is no abdominal tenderness.  Musculoskeletal:        General: Normal range of motion.     Cervical back: Neck supple.  Skin:    General: Skin is warm and dry.  Neurological:     General: No focal deficit present.     Mental Status: She is alert and oriented to person, place, and time.     Cranial Nerves: No cranial nerve deficit.     Sensory: No sensory deficit.     Motor: No weakness.     Gait: Gait normal.  Psychiatric:        Mood and Affect: Mood normal.        Behavior: Behavior normal.     UC Treatments / Results  Labs (all labs ordered are listed, but only abnormal results are displayed) Labs Reviewed - No data to display  EKG   Radiology No results found.  Procedures Procedures (including critical care time)  Medications Ordered in UC Medications  dexamethasone (DECADRON) injection 10 mg (has no administration in time range)    Initial Impression / Assessment and Plan / UC Course  I have reviewed the triage vital signs and the nursing notes.  Pertinent labs & imaging results that were available during my care of the patient were reviewed by me and considered in my medical decision making (see  chart for details).  Migraine headache, not intractable.  Patient has allergy to ibuprofen.  Treating with dexamethasone injection.  ED precautions discussed if he has worsening headache or other concerning symptoms.  Instructed her to follow-up with her PCP next week.  Education provided on migraine headaches.  She agrees to plan of care.   Final Clinical Impressions(s) / UC Diagnoses   Final diagnoses:  Migraine without status migrainosus, not intractable, unspecified migraine type     Discharge Instructions      You were given an injection of dexamethasone today.    Go to the emergency department if you have acute worsening headache or other concerning symptoms.         ED Prescriptions   None    I have reviewed the PDMP during this encounter.   Mickie Bail, NP 05/02/21 1544

## 2021-05-02 NOTE — Discharge Instructions (Addendum)
You were given an injection of dexamethasone today.    Go to the emergency department if you have acute worsening headache or other concerning symptoms.

## 2021-05-02 NOTE — ED Triage Notes (Signed)
Patient c/o migraines x 2 days.   Patient denies photosensitive, vomiting, LOC, or trauma.   Patient endorses nausea.   History of Migraines.   Patient has taken Imitrex with no relief of symptoms.

## 2021-05-29 ENCOUNTER — Ambulatory Visit
Admission: EM | Admit: 2021-05-29 | Discharge: 2021-05-29 | Disposition: A | Discharge status: Home / Self Care | Payer: BLUE CROSS/BLUE SHIELD | Attending: Emergency Medicine | Admitting: Emergency Medicine

## 2021-05-29 ENCOUNTER — Encounter: Payer: Self-pay | Admitting: Family Medicine

## 2021-05-29 DIAGNOSIS — G43909 Migraine, unspecified, not intractable, without status migrainosus: Secondary | ICD-10-CM

## 2021-05-29 MED ORDER — DEXAMETHASONE SODIUM PHOSPHATE 10 MG/ML IJ SOLN: 10.0000 mg | Freq: Once | INTRAMUSCULAR | Status: DC

## 2021-05-29 NOTE — Discharge Instructions (Addendum)
You were given an injection of dexamethasone today.  Schedule an appointment with your primary care provider soon as possible to evaluate your recurrent headaches.  Consider a referral to a neurologist.

## 2021-05-29 NOTE — ED Provider Notes (Signed)
Renaldo Fiddler    CSN: [id number] Arrival date & time: 05/29/21  1320      History   Chief Complaint Chief Complaint  Patient presents with   Migraine    HPI Felicia Ibarra is a 45 y.o. female.  Patient presents with intermittent headache x3 days.  She reports associated photosensitivity and nausea.  She states it feels like her typical migraine headaches.  She has taken Imitrex twice and her headache was improved but then returned.  She denies focal weakness, dizziness, numbness, chest pain, shortness of breath, vomiting, or other symptoms.  She also has taken Tylenol.  Patient was seen here on 05/02/2021; diagnosed with migraine headache; treated with dexamethasone; patient reports allergy to ibuprofen.  Her medical history includes migraine headaches, low back pain, obesity.  The history is provided by the patient and medical records.   Past Medical History:  Diagnosis Date   Low back pain, episodic    Migraine 2018    Patient Active Problem List   Diagnosis Date Noted   Family history of Hashimoto thyroiditis 01/22/2021   Fatigue 01/21/2021   Vaginal irritation 10/01/2020   Visit for routine gyn exam 01/23/2020   Screening mammogram, encounter for 01/23/2020   Migraines 09/03/2016   Routine general medical examination at a health care facility 03/15/2012   BACK PAIN 07/26/2009   Obesity (BMI 30-39.9) 02/09/2008   PLANTAR WART 08/22/2007    Past Surgical History:  Procedure Laterality Date   CESAREAN SECTION  2007    OB History   No obstetric history on file.      Home Medications    Prior to Admission medications   Medication Sig Start Date End Date Taking? Authorizing Provider  naproxen (NAPROSYN) 250 MG tablet Take 250 mg by mouth 2 (two) times daily with a meal.   Yes [provider]  SUMAtriptan (IMITREX) 100 MG tablet Take one tablet once, may repeat in 2 hours if headache persists or recurs. 04/18/21  Yes Tower, Audrie Gallus, MD   metroNIDAZOLE (METROGEL VAGINAL) 0.75 % vaginal gel Place 1 Applicatorful vaginally at bedtime. 01/15/21   Tower, Audrie Gallus, MD  nystatin-triamcinolone (MYCOLOG II) cream Apply 1 application topically 2 (two) times daily. 01/21/21   Mirna Mires, CNM    Family History Family History  Problem Relation Age of Onset   Diabetes Mother    Hypertension Mother    Hypothyroidism Mother    Cancer Father        prostate   Heart disease Maternal Grandfather        >70   Heart disease Paternal Grandfather        > 68   Hyperlipidemia Other    Hypothyroidism Other    Hypothyroidism Other    Sudden Cardiac Death Maternal Uncle    Atrial fibrillation Maternal Grandmother    Breast cancer Neg Hx     Social History Social History   Tobacco Use   Smoking status: Never   Smokeless tobacco: Never  Substance Use Topics   Alcohol use: No    Alcohol/week: 0.0 standard drinks   Drug use: No     Allergies   Amoxicillin, Cefaclor, and Ibuprofen   Review of Systems Review of Systems  Constitutional:  Negative for chills and fever.  Eyes:  Positive for photophobia. Negative for pain and visual disturbance.  Respiratory:  Negative for cough and shortness of breath.   Cardiovascular:  Negative for chest pain and palpitations.  Gastrointestinal:  Positive  for nausea. Negative for abdominal pain and vomiting.  Neurological:  Positive for headaches. Negative for dizziness, weakness and numbness.  All other systems reviewed and are negative.   Physical Exam Triage Vital Signs ED Triage Vitals  Enc Vitals Group     BP 05/29/21 1340 120/89     Pulse Rate 05/29/21 1340 94     Resp 05/29/21 1340 18     Temp 05/29/21 1340 99.1 F (37.3 C)     Temp Source 05/29/21 1340 Oral     SpO2 05/29/21 1340 98 %     Weight --      Height --      Head Circumference --      Peak Flow --      Pain Score 05/29/21 1337 8     Pain Loc --      Pain Edu? --      Excl. in GC? --    No data  found.  Updated Vital Signs BP 120/89 (BP Location: Left Arm)   Pulse 94   Temp 99.1 F (37.3 C) (Oral)   Resp 18   LMP 05/11/2021   SpO2 98%   Visual Acuity Right Eye Distance:   Left Eye Distance:   Bilateral Distance:    Right Eye Near:   Left Eye Near:    Bilateral Near:     Physical Exam Vitals and nursing note reviewed.  Constitutional:      General: She is not in acute distress.    Appearance: She is well-developed. She is not ill-appearing.  HENT:     Head: Normocephalic and atraumatic.     Right Ear: Tympanic membrane normal.     Left Ear: Tympanic membrane normal.     Nose: Nose normal.     Mouth/Throat:     Mouth: Mucous membranes are moist.     Pharynx: Oropharynx is clear.  Eyes:     Extraocular Movements: Extraocular movements intact.     Conjunctiva/sclera: Conjunctivae normal.     Pupils: Pupils are equal, round, and reactive to light.  Cardiovascular:     Rate and Rhythm: Normal rate and regular rhythm.     Heart sounds: Normal heart sounds.  Pulmonary:     Effort: Pulmonary effort is normal. No respiratory distress.     Breath sounds: Normal breath sounds.  Abdominal:     Palpations: Abdomen is soft.     Tenderness: There is no abdominal tenderness.  Musculoskeletal:     Cervical back: Neck supple.  Skin:    General: Skin is warm and dry.  Neurological:     General: No focal deficit present.     Mental Status: She is alert and oriented to person, place, and time.     Cranial Nerves: No cranial nerve deficit.     Sensory: No sensory deficit.     Motor: No weakness.     Gait: Gait normal.  Psychiatric:        Mood and Affect: Mood normal.        Behavior: Behavior normal.     UC Treatments / Results  Labs (all labs ordered are listed, but only abnormal results are displayed) Labs Reviewed - No data to display  EKG   Radiology No results found.  Procedures Procedures (including critical care time)  Medications Ordered in  UC Medications  dexamethasone (DECADRON) injection 10 mg (has no administration in time range)    Initial Impression / Assessment and Plan / UC Course  I have reviewed the triage vital signs and the nursing notes.  Pertinent labs & imaging results that were available during my care of the patient were reviewed by me and considered in my medical decision making (see chart for details).  Migraine headache without status migrainous, not intractable.  Patient is well-appearing and her exam is reassuring.  She is neurologically intact.  Treated with dexamethasone injection today; patient reports this relieved her headache completely when she was seen here last month.  Instructed her to follow-up with her primary care provider and to consider a referral to a neurologist.  Patient agrees to plan of care.   Final Clinical Impressions(s) / UC Diagnoses   Final diagnoses:  Migraine without status migrainosus, not intractable, unspecified migraine type     Discharge Instructions      You were given an injection of dexamethasone today.  Schedule an appointment with your primary care provider soon as possible to evaluate your recurrent headaches.  Consider a referral to a neurologist.         ED Prescriptions   None    PDMP not reviewed this encounter.   Mickie Bail, NP 05/29/21 1404

## 2021-05-29 NOTE — ED Triage Notes (Signed)
Pt presents for migraine x 3 days.  Has tried Imitrex which does relieve it through the day then it has returned each morning. Instructions for Imitrex are two per migraine cycle. Migraines are normally better after second Imitrex.   Migraines have typically been on one side and this had rotated from L to R sinus area radiating past R temple.    + nausea, intermittent photosensitivity

## 2021-06-03 ENCOUNTER — Other Ambulatory Visit: Payer: Self-pay

## 2021-06-03 DIAGNOSIS — Z1231 Encounter for screening mammogram for malignant neoplasm of breast: Secondary | ICD-10-CM

## 2021-06-13 ENCOUNTER — Other Ambulatory Visit: Payer: Self-pay

## 2021-06-13 ENCOUNTER — Ambulatory Visit
Admission: RE | Admit: 2021-06-13 | Discharge: 2021-06-13 | Disposition: A | Discharge status: Home / Self Care | Payer: BLUE CROSS/BLUE SHIELD | Source: Ambulatory Visit

## 2021-06-13 DIAGNOSIS — Z1231 Encounter for screening mammogram for malignant neoplasm of breast: Secondary | ICD-10-CM | POA: Insufficient documentation

## 2021-06-19 ENCOUNTER — Other Ambulatory Visit: Payer: Self-pay

## 2021-06-19 DIAGNOSIS — R928 Other abnormal and inconclusive findings on diagnostic imaging of breast: Secondary | ICD-10-CM

## 2021-06-24 ENCOUNTER — Ambulatory Visit
Admission: RE | Admit: 2021-06-24 | Discharge: 2021-06-24 | Disposition: A | Discharge status: Home / Self Care | Payer: BLUE CROSS/BLUE SHIELD | Source: Ambulatory Visit

## 2021-06-24 ENCOUNTER — Other Ambulatory Visit: Payer: Self-pay

## 2021-06-24 DIAGNOSIS — R928 Other abnormal and inconclusive findings on diagnostic imaging of breast: Secondary | ICD-10-CM | POA: Insufficient documentation

## 2021-07-08 ENCOUNTER — Other Ambulatory Visit: Payer: Self-pay

## 2021-07-08 ENCOUNTER — Ambulatory Visit (INDEPENDENT_AMBULATORY_CARE_PROVIDER_SITE_OTHER): Payer: BLUE CROSS/BLUE SHIELD | Admitting: Family Medicine

## 2021-07-08 ENCOUNTER — Encounter: Payer: Self-pay | Admitting: Family Medicine

## 2021-07-08 VITALS — BP 136/84 | HR 84 | Temp 98.4°F | Ht 65.0 in | Wt 186.6 lb

## 2021-07-08 DIAGNOSIS — G43009 Migraine without aura, not intractable, without status migrainosus: Secondary | ICD-10-CM

## 2021-07-08 MED ORDER — PREDNISONE 10 MG PO TABS: ORAL_TABLET | ORAL | 0 refills | Status: DC

## 2021-07-08 MED ORDER — METHYLPREDNISOLONE ACETATE 40 MG/ML IJ SUSP
40.0000 mg | Freq: Once | INTRAMUSCULAR | Status: AC
  Administered 2021-07-08: 40 mg via INTRAMUSCULAR

## 2021-07-08 MED ORDER — NURTEC 75 MG PO TBDP: 75.0000 mg | ORAL_TABLET | Freq: Once | ORAL | 1 refills | Status: DC

## 2021-07-08 NOTE — Progress Notes (Signed)
Subjective:    Patient ID: Felicia Ibarra, female    DOB: 09-Aug-1976, 45 y.o.   MRN: [id number]  This visit occurred during the SARS-CoV-2 public health emergency.  Safety protocols were in place, including screening questions prior to the visit, additional usage of staff PPE, and extensive cleaning of exam room while observing appropriate contact time as indicated for disinfecting solutions.   HPI Pt presents for acute migraine  Wt Readings from Last 3 Encounters:  07/08/21 186 lb 9 oz (84.6 kg)  01/21/21 191 lb (86.6 kg)  01/15/21 191 lb 3 oz (86.7 kg)   31.05 kg/m  3 days  This coincided with cycle  Has had to have dexamethazone at urgent care in the past for headache  Imitrex- helps briefly and then it comes back  Took some naproxen-no help   Pain is R sided -not worse but not improving 8/10  Constant and at times pulsatile  No vis change No facial droop or eye watering   Nausea but no vomiting   In the process of moving  Very stressful  Triggers for migraine include hormone change (menses) and stress  Patient Active Problem List   Diagnosis Date Noted   Family history of Hashimoto thyroiditis 01/22/2021   Fatigue 01/21/2021   Vaginal irritation 10/01/2020   Visit for routine gyn exam 01/23/2020   Screening mammogram, encounter for 01/23/2020   Migraines 09/03/2016   Routine general medical examination at a health care facility 03/15/2012   BACK PAIN 07/26/2009   Obesity (BMI 30-39.9) 02/09/2008   PLANTAR WART 08/22/2007   Past Medical History:  Diagnosis Date   Low back pain, episodic    Migraine 2018   Past Surgical History:  Procedure Laterality Date   CESAREAN SECTION  2007   Social History   Tobacco Use   Smoking status: Never   Smokeless tobacco: Never  Substance Use Topics   Alcohol use: No    Alcohol/week: 0.0 standard drinks   Drug use: No   Family History  Problem Relation Age of Onset   Diabetes Mother    Hypertension Mother     Hypothyroidism Mother    Cancer Father        prostate   Heart disease Maternal Grandfather        >70   Heart disease Paternal Grandfather        > 10   Hyperlipidemia Other    Hypothyroidism Other    Hypothyroidism Other    Sudden Cardiac Death Maternal Uncle    Atrial fibrillation Maternal Grandmother    Breast cancer Neg Hx    Allergies  Allergen Reactions   Amoxicillin    Cefaclor     REACTION: hives   Ibuprofen     REACTION: hives   Current Outpatient Medications on File Prior to Visit  Medication Sig Dispense Refill   nystatin-triamcinolone (MYCOLOG II) cream Apply 1 application topically 2 (two) times daily. 30 g 0   SUMAtriptan (IMITREX) 100 MG tablet Take one tablet once, may repeat in 2 hours if headache persists or recurs. 10 tablet 11   No current facility-administered medications on file prior to visit.     Review of Systems  Constitutional:  Negative for activity change, appetite change, fatigue, fever and unexpected weight change.  HENT:  Negative for congestion, ear pain, rhinorrhea, sinus pressure and sore throat.   Eyes:  Negative for pain, redness and visual disturbance.  Respiratory:  Negative for cough, shortness of breath and  wheezing.   Cardiovascular:  Negative for chest pain and palpitations.  Gastrointestinal:  Positive for nausea. Negative for abdominal pain, blood in stool, constipation, diarrhea and vomiting.  Endocrine: Negative for polydipsia and polyuria.  Genitourinary:  Negative for dysuria, frequency and urgency.  Musculoskeletal:  Negative for arthralgias, back pain and myalgias.  Skin:  Negative for pallor and rash.  Allergic/Immunologic: Negative for environmental allergies.  Neurological:  Positive for headaches. Negative for dizziness, syncope, facial asymmetry, speech difficulty, weakness, light-headedness and numbness.  Hematological:  Negative for adenopathy. Does not bruise/bleed easily.  Psychiatric/Behavioral:  Negative for  decreased concentration and dysphoric mood. The patient is not nervous/anxious.       Objective:   Physical Exam Constitutional:      General: She is not in acute distress.    Appearance: Normal appearance. She is well-developed. She is obese. She is not ill-appearing or diaphoretic.  HENT:     Head: Normocephalic and atraumatic.  Eyes:     Conjunctiva/sclera: Conjunctivae normal.     Pupils: Pupils are equal, round, and reactive to light.  Neck:     Thyroid: No thyromegaly.     Vascular: No carotid bruit or JVD.  Cardiovascular:     Rate and Rhythm: Normal rate and regular rhythm.     Heart sounds: Normal heart sounds.    No gallop.  Pulmonary:     Effort: Pulmonary effort is normal. No respiratory distress.     Breath sounds: Normal breath sounds. No wheezing or rales.  Abdominal:     General: Abdomen is flat. There is no abdominal bruit.     Palpations: Abdomen is soft.  Musculoskeletal:     Cervical back: Normal range of motion and neck supple.     Right lower leg: No edema.     Left lower leg: No edema.  Lymphadenopathy:     Cervical: No cervical adenopathy.  Skin:    General: Skin is warm and dry.     Coloration: Skin is not pale.     Findings: No rash.  Neurological:     Mental Status: She is alert.     Cranial Nerves: Cranial nerves are intact. No dysarthria or facial asymmetry.     Sensory: Sensation is intact.     Motor: No weakness, abnormal muscle tone or pronator drift.     Coordination: Coordination is intact. Coordination normal. Finger-Nose-Finger Test normal.     Gait: Gait is intact.     Deep Tendon Reflexes: Reflexes are normal and symmetric. Reflexes normal.     Comments: Neg rhomberg    Psychiatric:        Mood and Affect: Mood normal.          Assessment & Plan:   Problem List Items Addressed This Visit       Cardiovascular and Mediastinum   Migraines - Primary    Acute migraine day 3 (trigger is menses and stress) In the process of  moving  imitrex did not help this time   Depo medrol shot today 40 mg IM Prednisone taper 30 mg to start tomorrow  nurtec sent to pharmacy since imitrex failed/will do prior auth if needed Still gets headache once monthly  Good health habits and fluids Update if not starting to improve in a week or if worsening  ER precautions discussed       Relevant Medications   Rimegepant Sulfate (NURTEC) 75 MG TBDP

## 2021-07-08 NOTE — Patient Instructions (Signed)
Nurtec is something to try for migraine instead of imitrex Keep drinking fluids   Shot today  Take prednisone as directed  Update if not starting to improve in a week or if worsening

## 2021-07-08 NOTE — Assessment & Plan Note (Signed)
Acute migraine day 3 (trigger is menses and stress) In the process of moving  imitrex did not help this time   Depo medrol shot today 40 mg IM Prednisone taper 30 mg to start tomorrow  nurtec sent to pharmacy since imitrex failed/will do prior auth if needed Still gets headache once monthly  Good health habits and fluids Update if not starting to improve in a week or if worsening  ER precautions discussed

## 2021-07-14 ENCOUNTER — Telehealth: Payer: Self-pay | Admitting: *Deleted

## 2021-07-14 NOTE — Telephone Encounter (Signed)
PA done for pt's nurtec at [email or web address] I will await a response

## 2021-07-23 NOTE — Telephone Encounter (Signed)
I filled out the appeal to the best of my ability.  In the IN box to send.  I px it for acute tx of migraine because imitrex does not work  Can send last progress note Thanks

## 2021-07-23 NOTE — Telephone Encounter (Signed)
PA and info faxed to appeal's dpt, I will await a response

## 2021-07-23 NOTE — Telephone Encounter (Signed)
PA was denied but they did send a fax for PCP to complete if you would like [place] to do an appeal. Form in your inbox

## 2021-07-29 ENCOUNTER — Telehealth: Payer: Self-pay | Admitting: Family Medicine

## 2021-07-29 NOTE — Telephone Encounter (Signed)
Lewis Moccasin from ([employer]) called stating that pt Felicia Ibarra Nurtec was denied,she faxed over Appeal information. Her number is [phone].  [id number]

## 2021-07-30 NOTE — Telephone Encounter (Signed)
Appeal form was faxed last week to that #, see separate phone note regarding PA

## 2021-09-03 ENCOUNTER — Encounter: Payer: Self-pay | Admitting: Family Medicine

## 2021-09-03 NOTE — Telephone Encounter (Signed)
See phone note from 07/29/21 I faxed an appeal letter to pt's insurance

## 2021-09-08 NOTE — Telephone Encounter (Signed)
Tim of [employer] would like a call back [phone] Ref # [id number], to discuss information about the appeal letter.

## 2021-09-11 NOTE — Telephone Encounter (Signed)
Working on this now

## 2021-09-11 NOTE — Telephone Encounter (Signed)
Called insurance was on hold for 35 minutes. Had to hang up to take another call/meeting. Will need to try again. Not sure how else to get through.

## 2021-09-12 ENCOUNTER — Telehealth: Payer: Self-pay | Admitting: Family Medicine

## 2021-09-12 NOTE — Telephone Encounter (Signed)
Tim from [place] called in requesting  a called back regarding pt medication # [phone] ref# [id number]

## 2021-09-23 NOTE — Telephone Encounter (Signed)
Called Tim with [employer]; message on [facility] call said prolonged wait time to speak with someone. I held for a period of time and then ended call. Will send note back to [place] in box.

## 2021-09-26 NOTE — Telephone Encounter (Signed)
Felicia Ibarra with [employer] called stating the best way to get in contact is through [email or web address] or [phone]

## 2021-10-01 NOTE — Telephone Encounter (Signed)
Tim form [employer] called wanted to leave his info about [employer]  [phone]

## 2021-10-29 NOTE — Telephone Encounter (Signed)
I have tried to call them x3 and was on hold over 30 min each time. If they call back please ask them to fax over whatever they need regarding the appeal

## 2021-12-08 ENCOUNTER — Encounter: Payer: Self-pay | Admitting: Family Medicine

## 2021-12-08 MED ORDER — NURTEC 75 MG PO TBDP: 75.0000 mg | ORAL_TABLET | Freq: Once | ORAL | 11 refills | Status: DC

## 2021-12-22 NOTE — Telephone Encounter (Signed)
PA done at [email or web address] ° °

## 2021-12-22 NOTE — Telephone Encounter (Signed)
Pt father called stating that pt insurance needs authorization for medication. Please advise.

## 2021-12-22 NOTE — Telephone Encounter (Signed)
Checked back at [email or web address] and PA was approved. Pt notified and I will await the approval fax. FYI to PCP

## 2021-12-22 NOTE — Telephone Encounter (Signed)
Received call from insurance that they would like to have request submitted as urgent on cover my meds so that it can be addressed sooner for patient. I advised that I would pass intonation on to her assistant who has received request from patient to process.

## 2022-02-16 ENCOUNTER — Encounter: Payer: Self-pay | Admitting: Family Medicine

## 2022-02-18 ENCOUNTER — Telehealth: Payer: Self-pay

## 2022-02-18 NOTE — Telephone Encounter (Signed)
PA started on CovermyMeds. Look thought chart and only found 1 triptan Pa asked about 2. Called the pt to see if she had tried more left a message. ?

## 2022-02-18 NOTE — Telephone Encounter (Signed)
Pt called back and has been on only 1 triptan. ?

## 2022-02-19 NOTE — Telephone Encounter (Signed)
PA denied  due to wanting pt to try to triptan. Letter for review in [place] box.  ?

## 2022-02-19 NOTE — Telephone Encounter (Signed)
If she wants to try a different triptan I will send maxalt. Does she want me to send it, if so what pharmacy? ?

## 2022-02-23 NOTE — Telephone Encounter (Signed)
Left a message on voicemail to call the office back. ?

## 2022-02-24 NOTE — Telephone Encounter (Signed)
See phone note, PA was denied  ?

## 2022-04-28 ENCOUNTER — Encounter: Payer: Self-pay | Admitting: Family Medicine

## 2022-04-29 ENCOUNTER — Telehealth: Payer: Self-pay | Admitting: Family Medicine

## 2022-04-29 MED ORDER — NURTEC 75 MG PO TBDP: 75.0000 mg | ORAL_TABLET | Freq: Once | ORAL | 11 refills | Status: AC

## 2022-04-29 NOTE — Telephone Encounter (Signed)
I left a vmail for patient to call back.  I need to verify that patient's BC[employer] that was scanned in on 05/29/21 is the most current insurance.  I started a prior auth for Nurtec 75MG  dispersible tablets. Key: [facility]  I received a message from Cover my meds: There was an error with your request.  Patient inactive.

## 2022-04-29 NOTE — Telephone Encounter (Signed)
I spoke to patient over phone.  She will send [place] in a copy of her new ins card.  Also, she stated I should not proceed with the new prior auth for Nurtec because Dr Milinda Antis has already sent in a letter for an appeal for approval.

## 2022-04-29 NOTE — Telephone Encounter (Signed)
-----   Message from Barkley Bruns, [place] sent at 04/29/2022 12:53 PM EDT ----- Regarding: FW: Nurtec prescription  ----- Message ----- From: Velna Hatchet, RT Sent: 04/29/2022  11:54 AM EDT To: Tammi Sou, CMA Subject: Nurtec prescription                            I received a prior auth request for Nurtec for this patient.  I do not see that prescription on her chart.  Should I go ahead with PA or disregard?  Thanks,  Anda Kraft

## 2022-04-29 NOTE — Telephone Encounter (Signed)
I also wrote a letter to send to her insurance to appeal See her last mychart message   I tried to print it but it would not print

## 2022-04-30 NOTE — Telephone Encounter (Signed)
I spoke to [facility], who said I need to go ahead and start a new prior auth for Nurtec.  I do not see a current prescription for Nurtec on patient's chart.

## 2022-04-30 NOTE — Telephone Encounter (Signed)
I think I sent a new px yesterday

## 2022-05-01 ENCOUNTER — Other Ambulatory Visit: Payer: Self-pay | Admitting: *Deleted

## 2022-05-01 NOTE — Telephone Encounter (Signed)
PA completed on CoverMyMeds for Nurtec 75 mg ODT.  PA sent for review and can take up to 72 hours for a decision.

## 2022-05-01 NOTE — Telephone Encounter (Signed)
PA approved from 05/01/22 through 05/01/23.  [facility] notified of approval via fax.  Left message for Felicia Ibarra letting her know the PA was approved.

## 2023-01-22 ENCOUNTER — Other Ambulatory Visit (HOSPITAL_COMMUNITY): Payer: Self-pay

## 2023-05-03 ENCOUNTER — Telehealth: Payer: Self-pay

## 2023-05-03 ENCOUNTER — Other Ambulatory Visit (HOSPITAL_COMMUNITY): Payer: Self-pay

## 2023-05-03 NOTE — Telephone Encounter (Signed)
Thanks for the heads up Please let pt know if she doesn't already

## 2023-05-03 NOTE — Telephone Encounter (Signed)
Patient Advocate Encounter   Received notification from [employer] that prior authorization is required for Nurtec 75MG  dispersible tablets   Submitted: 0-17-2024 Key [id number]   PA was cancelled as no authorization is needed. Insurance pays for a maximum of 16 tablets per 25 days. Patient last filled 10 tablets on 04-19-23 at [facility] and can fill remaining 6 tablets on 05-14-23

## 2023-05-04 NOTE — Telephone Encounter (Signed)
Left message to return call to our office.  

## 2023-05-05 NOTE — Telephone Encounter (Signed)
Mychart message to pt.
